# Patient Record
Sex: Female | Born: 1960 | Race: White | Hispanic: No | Smoking: Never smoker
Health system: Southern US, Community
[De-identification: ages and names within clinical notes are randomized; demographics above are authoritative.]

## PROBLEM LIST (undated history)

## (undated) DIAGNOSIS — Z923 Personal history of irradiation: Secondary | ICD-10-CM

## (undated) DIAGNOSIS — F410 Panic disorder [episodic paroxysmal anxiety] without agoraphobia: Secondary | ICD-10-CM

## (undated) DIAGNOSIS — J301 Allergic rhinitis due to pollen: Secondary | ICD-10-CM

## (undated) DIAGNOSIS — K219 Gastro-esophageal reflux disease without esophagitis: Secondary | ICD-10-CM

## (undated) DIAGNOSIS — E785 Hyperlipidemia, unspecified: Secondary | ICD-10-CM

## (undated) DIAGNOSIS — I10 Essential (primary) hypertension: Secondary | ICD-10-CM

## (undated) DIAGNOSIS — F339 Major depressive disorder, recurrent, unspecified: Secondary | ICD-10-CM

## (undated) DIAGNOSIS — M159 Polyosteoarthritis, unspecified: Secondary | ICD-10-CM

## (undated) DIAGNOSIS — C541 Malignant neoplasm of endometrium: Secondary | ICD-10-CM

## (undated) DIAGNOSIS — Z5189 Encounter for other specified aftercare: Secondary | ICD-10-CM

## (undated) HISTORY — DX: Gastro-esophageal reflux disease without esophagitis: K21.9

## (undated) HISTORY — PX: TONSILLECTOMY AND ADENOIDECTOMY: SHX28

## (undated) HISTORY — DX: Essential (primary) hypertension: I10

## (undated) HISTORY — DX: Encounter for other specified aftercare: Z51.89

## (undated) HISTORY — PX: ABDOMINAL HYSTERECTOMY: SHX81

## (undated) HISTORY — DX: Major depressive disorder, recurrent, unspecified: F33.9

## (undated) HISTORY — PX: TONSILLECTOMY: SUR1361

## (undated) HISTORY — PX: TOTAL ABDOMINAL HYSTERECTOMY W/ BILATERAL SALPINGOOPHORECTOMY: SHX83

## (undated) HISTORY — DX: Panic disorder (episodic paroxysmal anxiety): F41.0

## (undated) HISTORY — DX: Polyosteoarthritis, unspecified: M15.9

## (undated) HISTORY — DX: Malignant neoplasm of endometrium: C54.1

## (undated) HISTORY — PX: COLONOSCOPY: SHX174

## (undated) HISTORY — DX: Allergic rhinitis due to pollen: J30.1

## (undated) HISTORY — DX: Hyperlipidemia, unspecified: E78.5

---

## 2010-02-19 ENCOUNTER — Ambulatory Visit: Payer: Self-pay | Admitting: Gastroenterology

## 2013-03-08 ENCOUNTER — Emergency Department: Payer: Self-pay | Admitting: Emergency Medicine

## 2013-09-28 DIAGNOSIS — Z923 Personal history of irradiation: Secondary | ICD-10-CM

## 2013-09-28 HISTORY — DX: Personal history of irradiation: Z92.3

## 2013-10-26 ENCOUNTER — Ambulatory Visit: Payer: Self-pay | Admitting: Gynecologic Oncology

## 2013-10-31 ENCOUNTER — Ambulatory Visit: Payer: Self-pay | Admitting: Gynecologic Oncology

## 2013-10-31 LAB — HCG, QUANTITATIVE, PREGNANCY: BETA HCG, QUANT.: 3 m[IU]/mL

## 2013-10-31 LAB — CREATININE, SERUM
Creatinine: 1.13 mg/dL (ref 0.60–1.30)
EGFR (Non-African Amer.): 55 — ABNORMAL LOW

## 2013-11-01 LAB — CA 125: CA 125: 33.6 U/mL (ref 0.0–34.0)

## 2013-11-07 ENCOUNTER — Ambulatory Visit: Payer: Self-pay | Admitting: Obstetrics and Gynecology

## 2013-11-07 LAB — BASIC METABOLIC PANEL
Anion Gap: 4 — ABNORMAL LOW (ref 7–16)
BUN: 18 mg/dL (ref 7–18)
Calcium, Total: 9.7 mg/dL (ref 8.5–10.1)
Chloride: 105 mmol/L (ref 98–107)
Co2: 28 mmol/L (ref 21–32)
Creatinine: 1.13 mg/dL (ref 0.60–1.30)
EGFR (Non-African Amer.): 55 — ABNORMAL LOW
Glucose: 95 mg/dL (ref 65–99)
Osmolality: 276 (ref 275–301)
POTASSIUM: 4.1 mmol/L (ref 3.5–5.1)
Sodium: 137 mmol/L (ref 136–145)

## 2013-11-07 LAB — CBC
HCT: 32.6 % — ABNORMAL LOW (ref 35.0–47.0)
HGB: 10.9 g/dL — ABNORMAL LOW (ref 12.0–16.0)
MCH: 28.4 pg (ref 26.0–34.0)
MCHC: 33.5 g/dL (ref 32.0–36.0)
MCV: 85 fL (ref 80–100)
PLATELETS: 293 10*3/uL (ref 150–440)
RBC: 3.86 10*6/uL (ref 3.80–5.20)
RDW: 14.8 % — ABNORMAL HIGH (ref 11.5–14.5)
WBC: 6 10*3/uL (ref 3.6–11.0)

## 2013-11-26 ENCOUNTER — Ambulatory Visit: Payer: Self-pay | Admitting: Gynecologic Oncology

## 2013-12-27 ENCOUNTER — Ambulatory Visit: Payer: Self-pay | Admitting: Gynecologic Oncology

## 2014-01-15 ENCOUNTER — Ambulatory Visit: Payer: Self-pay | Admitting: Gynecologic Oncology

## 2014-01-26 ENCOUNTER — Ambulatory Visit: Payer: Self-pay | Admitting: Gynecologic Oncology

## 2014-02-26 ENCOUNTER — Ambulatory Visit: Payer: Self-pay | Admitting: Gynecologic Oncology

## 2014-03-28 ENCOUNTER — Ambulatory Visit: Payer: Self-pay | Admitting: Radiation Oncology

## 2014-08-14 ENCOUNTER — Ambulatory Visit: Payer: Self-pay | Admitting: Oncology

## 2014-08-15 LAB — CREATININE, SERUM: CREATININE: 0.92 mg/dL (ref 0.60–1.30)

## 2014-08-28 ENCOUNTER — Ambulatory Visit: Payer: Self-pay | Admitting: Obstetrics and Gynecology

## 2014-08-28 ENCOUNTER — Ambulatory Visit: Payer: Self-pay | Admitting: Oncology

## 2014-12-12 ENCOUNTER — Ambulatory Visit
Admit: 2014-12-12 | Disposition: A | Discharge status: Home / Self Care | Payer: Self-pay | Attending: Obstetrics and Gynecology | Admitting: Obstetrics and Gynecology

## 2014-12-28 ENCOUNTER — Ambulatory Visit
Admit: 2014-12-28 | Disposition: A | Discharge status: Home / Self Care | Payer: Self-pay | Attending: Obstetrics and Gynecology | Admitting: Obstetrics and Gynecology

## 2015-01-14 ENCOUNTER — Ambulatory Visit
Admit: 2015-01-14 | Disposition: A | Discharge status: Home / Self Care | Payer: Self-pay | Attending: Urgent Care | Admitting: Urgent Care

## 2015-01-19 NOTE — Consult Note (Signed)
Reason for Visit: This 54 year old Female patient presents to the clinic for initial evaluation of  endometrial carcinoma .   Referred by dr. Sabra Heck.  Diagnosis:  Chief Complaint/Diagnosis   54 year old female with high intermediate grade endometrial carcinoma status post TAH/BSO for stage TI B. N0 M0 disease with greater than 50% myometrial invasion an initial large 6 cm tumor for adjuvant vaginal cuff brachytherapy  Pathology Report pathology report reviewed   Imaging Report CT scan reviewed   Referral Report clinical notes reviewed   Planned Treatment Regimen adjuvant brachy therapy to the vaginal cuff   HPI   patient is a 54 year old female gravida 2 para 2 presented with postmenopausal bleeding in about June of 2014. She also had some pelvic pain was seen and underwent endometrial biopsy positive for endometrial carcinoma. She was seen by Dr. Sabra Heck underwent TAH/BSO and pelvic and perineal aortic lymph node sampling. Tumor was 6 x 3.2 x 1.6 overall FIGO grade 2. Myometrial invasion was 1.2 cm out of a total of 1.9 cm thickness. There was positive malignant cells in the peritoneal fluid. Lymphovascular invasion with negative margins were clear 18 pelvic lymph nodes were negative as well as for para-aortic lymph nodes. She has done well postoperatively. He's she is seen today for a radiation oncology opinion. Based on her high intermediate grade tumor she has been recommended for vaginal brachytherapy.she is having some back pain is scheduled for an MRI next week as well as a renal ultrasound.  Past Hx:    hyperlipidemia:    HTN:    blood transfusion:    anxiety:    TAH/BSO:    tonsillectomy:   Past, Family and Social History:  Past Medical History positive   Cardiovascular hyperlipidemia; hypertension   Past Surgical History tonsillectomy, TAH BSO as described above   Past Medical History Comments blood transfusion, degenerative disc disease   Family History positive    Family History Comments family history of sister with breast cancer father with colon polyps family history of type 2 diabetes and cardiovascular heart disease   Social History positive   Social History Comments social EtOH use history, no smoking history   Additional Past Medical and Surgical History accompanied by husband today   Allergies:   Lisinopril: Swelling  chlorobutanol containing compounds: Rash, Itching  chlorhexidine containing compounds: Rash, Itching  Tricor: Unknown  Home Meds:  Home Medications: Medication Instructions Status  simvastatin 40 mg oral tablet 1 tab(s) orally once a day (at bedtime) Active  Xanax 0.5 mg oral tablet 1 tab(s) orally 3 times a day Active  Ventolin CFC free 90 mcg/inh inhalation aerosol 2 puff(s) inhaled 4 times a day, As Needed Active  Flonase 50 mcg/inh nasal spray 1 spray(s) nasal once a day Active  gabapentin 600 mg oral tablet 1 tab(s) orally 3 times a day, As Needed Active  Hyzaar 12.5 mg-50 mg oral tablet 1 tab(s) orally once a day Active  Pataday 0.2% ophthalmic solution 1 drop(s) to each affected eye once a day, As Needed Active  Symbicort 160 mcg-4.5 mcg/inh inhalation aerosol 2 puff(s) inhaled 2 times a day Active  Vitamin D3 5000 intl units oral capsule 1 cap(s) orally once a day Active  buPROPion 150 mg/12 hours oral tablet, extended release 1 tab(s) orally 2 times a day Active   Review of Systems:  General negative   Performance Status (ECOG) 0   Skin negative   Breast negative   Ophthalmologic negative   ENMT negative  Respiratory and Thorax negative   Cardiovascular negative   Gastrointestinal negative   Genitourinary see HPI   Musculoskeletal see HPI   Neurological negative   Psychiatric negative   Hematology/Lymphatics negative   Endocrine negative   Allergic/Immunologic negative   Review of Systems   review of systems obtained from nurses notes  Nursing Notes:  Nursing Vital Signs and  Chemo Nursing Nursing Notes: *CC Vital Signs Flowsheet:   21-Apr-15 10:32  Temp Temperature 98.6  Pulse Pulse 18  Respirations Respirations 18  SBP SBP 152  DBP DBP 91  Pain Scale (0-10)  1  Current Weight (kg) (kg) 86.1  Height (cm) centimeters 168  BSA (m2) 1.9   Physical Exam:  General/Skin/HEENT:  General normal   Skin normal   Eyes normal   ENMT normal   Head and Neck normal   Additional PE well-developed well-nourished female in NAD. Lungs are clear to A&P cardiac examination shows regular rate and rhythm. Abdomen is benign on speculum examination she still has suture material present in the vaginal vault apex although it is healing well. No parametrial masses or nodularities identified on bimanual examination rectal exam is unremarkable. No peripheral edema in her lower extremities is noted.   Breasts/Resp/CV/GI/GU:  Respiratory and Thorax normal   Cardiovascular normal   Gastrointestinal normal   Genitourinary normal   MS/Neuro/Psych/Lymph:  Musculoskeletal normal   Neurological normal   Lymphatics normal   Other Results:  Radiology Results: LabUnknown:    06-Feb-15 14:22, CT Chest, Abd, and Pelvis With Contrast  PACS Image   CT:  CT Chest, Abd, and Pelvis With Contrast   REASON FOR EXAM:    Endometerial CA Staging  COMMENTS:       PROCEDURE: CT  - CT CHEST ABDOMEN AND PELVIS W  - Nov 03 2013  2:22PM     CLINICAL DATA:  Recent diagnosis of endometrial carcinoma.  Staging.    EXAM:  CT CHEST, ABDOMEN, AND PELVIS WITH CONTRAST    TECHNIQUE:  Multidetector CT imaging of the chest, abdomen and pelvis was  performed following the standard protocol during bolus  administration of intravenous contrast.  CONTRAST:  125 mm of Isovue 370 intravenous contrast    COMPARISON:None    FINDINGS:  CT CHEST FINDINGS    No neck base or axillary masses or adenopathy.    Heart and great vessels are unremarkable. No mediastinal or hilar  masses or  adenopathy.    Clear lungs.  No pleural effusion.  No pleural-based mass.    CT ABDOMEN AND PELVIS FINDINGS  Small low-density lesion in the posterior segment of the right lobe  of the liver, likely a cyst. Liver is otherwise unremarkable.    Normal spleen, gallbladder and pancreas. No bile duct dilation. No  adrenal masses. Normal kidneys, ureters and bladder.    Uterus is normal in overall size. There is irregular endometrium  with irregular marginal enhancement. This is consistent with the  diagnosis of endometrial carcinoma. There is no evidence of  extension beyond the uterus. No ovarian/adnexal masses.    No pathologically enlarged lymph nodes. There are no abnormal fluid  collections.    The bowel is unremarkable.  A normal appendix is visualized.  There are degenerative changes most evident along the lower thoracic  spine with significant sclerosis at T10-T11. There are no  osteoblastic or osteolytic lesions.     IMPRESSION:  1. Endometrial carcinoma appears confined in the uterus.  Specifically, there is no evidence  of locally invasive endometrial  carcinoma. There is no evidence of metastatic disease.  2. No acute findings.  3. Chest CT is unremarkable.  4. Small low-density lesion in the liver likely a cyst.  5. There are degenerative changes throughout the visualized spine  most evident in the lower thoracic spine.    Electronically Signed    By: Lajean Manes M.D.    On: 11/03/2013 15:18         Verified By: Lasandra Beech, M.D.,   Relevent Results:   Relevant Scans and Labs CT scans are reviewed   Assessment and Plan: Impression:   stage IB large FIGO grade 2 endometrial carcinoma with greater than 50% myometrial invasionin 54 year old female Plan:   this time based on GOG recommendations would recommend vaginal brachy therapyto the vaginal apex. Would treat in 5 fractions to 2350 cGy using high-dose rate remote afterloading. Risks and benefits of  treatment including some possible diarrhea, urinary frequency urgency, fatigue and ultimate stenosis of the vaginal vault were all explained in detail to the patient. I am going to ignore the positive peritoneal cytology although I will discuss the case with medical oncology. Do not think that she come to my treatment decision as far as external beam treatment. Patient was set up in about a week's time for CT simulation.  I would like to take this opportunity for allowing me to participate in the care of your patient..  CC Referral:  cc: Dr. Alice Reichert   Electronic Signatures: Joban Colledge, Roda Shutters (MD)  (Signed 24-Apr-15 11:22)  Authored: HPI, Diagnosis, Past Hx, PFSH, Allergies, Home Meds, ROS, Nursing Notes, Physical Exam, Other Results, Relevent Results, Encounter Assessment and Plan, CC Referring Physician   Last Updated: 24-Apr-15 11:22 by Armstead Peaks (MD)

## 2015-02-26 ENCOUNTER — Telehealth: Payer: Self-pay | Admitting: *Deleted

## 2015-02-26 NOTE — Telephone Encounter (Signed)
Patient called. "states she has a 'question' about her medical care with Dr. Theora Gianotti" "Please have Joani Cosma call me back."  Call attempt made to reach patient at [phone]. No answer. Left msg that call was returned.

## 2015-02-28 NOTE — Telephone Encounter (Signed)
Attempted to call Patient back. Pt wanted to know when her last apt was scheduled with Dr. Theora Gianotti.  She was last seen on March 16. Her next apt was April 10, 2015.

## 2015-04-10 ENCOUNTER — Inpatient Hospital Stay: Payer: Self-pay | Attending: Obstetrics and Gynecology

## 2015-05-22 ENCOUNTER — Encounter: Payer: Self-pay | Admitting: Family Medicine

## 2015-05-22 NOTE — Progress Notes (Signed)
This encounter was created in error - please disregard.

## 2015-06-24 ENCOUNTER — Ambulatory Visit (INDEPENDENT_AMBULATORY_CARE_PROVIDER_SITE_OTHER): Payer: Self-pay | Admitting: Family Medicine

## 2015-06-24 ENCOUNTER — Encounter: Payer: Self-pay | Admitting: Family Medicine

## 2015-06-24 ENCOUNTER — Encounter (INDEPENDENT_AMBULATORY_CARE_PROVIDER_SITE_OTHER): Payer: Self-pay

## 2015-06-24 VITALS — BP 177/100 | HR 83 | Temp 98.0°F | Ht 65.0 in | Wt 203.2 lb

## 2015-06-24 DIAGNOSIS — M150 Primary generalized (osteo)arthritis: Secondary | ICD-10-CM

## 2015-06-24 DIAGNOSIS — I10 Essential (primary) hypertension: Secondary | ICD-10-CM

## 2015-06-24 DIAGNOSIS — C541 Malignant neoplasm of endometrium: Secondary | ICD-10-CM

## 2015-06-24 DIAGNOSIS — J301 Allergic rhinitis due to pollen: Secondary | ICD-10-CM

## 2015-06-24 DIAGNOSIS — F410 Panic disorder [episodic paroxysmal anxiety] without agoraphobia: Secondary | ICD-10-CM

## 2015-06-24 DIAGNOSIS — E785 Hyperlipidemia, unspecified: Secondary | ICD-10-CM

## 2015-06-24 DIAGNOSIS — F339 Major depressive disorder, recurrent, unspecified: Secondary | ICD-10-CM

## 2015-06-24 DIAGNOSIS — M159 Polyosteoarthritis, unspecified: Secondary | ICD-10-CM

## 2015-06-24 DIAGNOSIS — K219 Gastro-esophageal reflux disease without esophagitis: Secondary | ICD-10-CM

## 2015-06-24 MED ORDER — ALPRAZOLAM 0.5 MG PO TABS: 0.5000 mg | ORAL_TABLET | Freq: Three times a day (TID) | ORAL | Status: DC | PRN

## 2015-06-24 MED ORDER — FLUOXETINE HCL 20 MG PO CAPS: 20.0000 mg | ORAL_CAPSULE | Freq: Every day | ORAL | Status: DC

## 2015-06-24 MED ORDER — MELOXICAM 15 MG PO TABS
15.0000 mg | ORAL_TABLET | Freq: Every day | ORAL | Status: DC
Start: 2015-06-24 — End: 2015-06-24

## 2015-06-24 MED ORDER — HYDROCHLOROTHIAZIDE 25 MG PO TABS: 25.0000 mg | ORAL_TABLET | Freq: Every day | ORAL | Status: DC

## 2015-06-24 MED ORDER — MELOXICAM 15 MG PO TABS
15.0000 mg | ORAL_TABLET | Freq: Every day | ORAL | Status: DC
Start: 2015-06-24 — End: 2015-11-18

## 2015-06-24 MED ORDER — TRAMADOL HCL 50 MG PO TABS: 50.0000 mg | ORAL_TABLET | Freq: Four times a day (QID) | ORAL | Status: DC | PRN

## 2015-06-24 NOTE — Progress Notes (Signed)
Pre visit review using our clinic review tool, if applicable. No additional management support is needed unless otherwise documented below in the visit note. 

## 2015-06-24 NOTE — Progress Notes (Signed)
Dr. Frederico Hamman T. Mica Ramdass, MD, [facility] [place], [address] Phone: [phone] Fax: [phone]  06/24/2015  Patient: Jennifer Holt, MRN: [id number], DOB: Dec 16, 1960, 54 y.o.  Primary Physician:  Owens Loffler, MD  Chief Complaint: Establish Care  Subjective:   Jennifer Holt is a 54 y.o. very pleasant female patient who presents with the following:  New patient visit: seen at the request of Mr. [employer].  Multiple medical problems including a history of stage II intermedial cancer treated with total abdominal hysterectomy and bilateral salpingo-oophorectomy.  She also had radiation therapy by radiation oncology.  This was within the last 2 years.  Hypertension, not well controlled, out of medications right now.  Depression and panic attacks, currently unstable and out of medications for greater than 3 months.  She previously was on Wellbutrin and took some Xanax as needed.  She generally was taking her Xanax somewhere between 1 and 3 times a day.  She also has been having some back pain, but this is not new and is been going on for many years.  She does have some degenerative disc disease and spondyloarthropathy on her prior CT of her abdomen and pelvis, but I do not have this available for my independent review.  hctz 25 prozac 20 mobic 15 Tramadol 50  Xanax 0.5  R knee: patient also about 2 months ago stepped-down had some pain in her right knee with some swelling, and is intermittently been bothering her.  At baseline she is not really very active at all.  Orange card  Back pain  Patient Active Problem List   Diagnosis Date Noted  . FIGO stage II endometrial cancer     Priority: High  . Hypertension   . Hyperlipidemia   . Allergic rhinitis due to pollen   . GERD (gastroesophageal reflux disease)   . Major depressive disorder, recurrent episode with anxious distress   . Panic attacks   .  Osteoarthritis, multiple sites     Past Medical History  Diagnosis Date  . Osteoarthritis, multiple sites   . Major depressive disorder, recurrent episode with anxious distress   . GERD (gastroesophageal reflux disease)   . Allergic rhinitis due to pollen   . Hyperlipidemia   . Hypertension   . Blood transfusion without reported diagnosis   . FIGO stage II endometrial cancer     [facility]. + malignant cells in peritoneal fluid.   . Panic attacks     Past Surgical History  Procedure Laterality Date  . Total abdominal hysterectomy w/ bilateral salpingoophorectomy    . Tonsillectomy and adenoidectomy      Social History   Social History  . Marital Status: Married    Spouse Name: N/A  . Number of Children: N/A  . Years of Education: N/A   Occupational History  . Not on file.   Social History Main Topics  . Smoking status: Never Smoker   . Smokeless tobacco: Never Used  . Alcohol Use: 1.8 - 2.4 oz/week    3-4 Standard drinks or equivalent per week  . Drug Use: No  . Sexual Activity: Not on file   Other Topics Concern  . Not on file   Social History Narrative    Family History  Problem Relation Age of Onset  . Arthritis Mother   . Hyperlipidemia Mother   . Stroke Mother   . Hypertension Mother   . Mental illness Mother   .  Hyperlipidemia Father   . Heart disease Father   . Hypertension Father   . Mental illness Brother   . Stroke Maternal Grandmother   . Hypertension Maternal Grandmother   . Mental illness Brother   . Breast cancer Sister     Allergies  Allergen Reactions  . Lisinopril Swelling  . Prednisone Other (See Comments)    Flu like symptoms    Medication list reviewed and updated in full in [place].   Past Medical History, Surgical History, Social History, Family History, Problem List, Medications, and Allergies have been reviewed and updated if relevant.  Medication list reviewed and updated in full in [place].  GEN: No acute illnesses, no fevers, chills. GI: No n/v/d, eating normally Pulm: No SOB Interactive and getting along well at home.  Otherwise, ROS is as per the HPI.  Objective:   BP 177/100 mmHg  Pulse 83  Temp(Src) 98 F (36.7 C) (Oral)  Ht 5\' 5"  (1.651 m)  Wt 203 lb 4 oz (92.194 kg)  BMI 33.82 kg/m2  GEN: WDWN, NAD, Non-toxic, A & O x 3 HEENT: Atraumatic, Normocephalic. Neck supple. No masses, No LAD. Ears and Nose: No external deformity. CV: RRR, No M/G/R. No JVD. No thrill. No extra heart sounds. PULM: CTA B, no wheezes, crackles, rhonchi. No retractions. No resp. distress. No accessory muscle use. EXTR: No c/c/e NEURO Normal gait.  PSYCH: Normally interactive. Conversant. Not depressed or anxious appearing.  Calm demeanor.   Knee:  R Gait: Normal heel toe pattern ROM: 0-110 Effusion: mild Echymosis or edema: none Patellar tendon NT Painful PLICA: neg Patellar grind: negative Medial and lateral patellar facet loading: negative medial and lateral joint lines: posteromedial pain Mcmurray's + for pain Flexion-pinch + Varus and valgus stress: stable Lachman: neg Ant and Post drawer: neg Hip abduction, IR, ER: WNL Hip flexion str: 5/5 Hip abd: 5/5 Quad: 5/5 VMO atrophy:No Hamstring concentric and eccentric: 5/5   Laboratory and Imaging Data:  Assessment and Plan:   Essential hypertension  FIGO stage II endometrial cancer  Panic attacks  Hyperlipidemia  Allergic rhinitis due to pollen  Gastroesophageal reflux disease, esophagitis presence not specified  Major depressive disorder, recurrent episode with anxious distress  Primary osteoarthritis involving multiple joints  Given financial assistance information for hospital and orange card.   Desires [facility] meds if possible.   Convert to HCTZ 25 mg  Dep / panic: start prozac with prn xanax  mobic and tramadol for pain, back and knee Most likely medial meniscal tear, trial of  conservative management for now.   Follow-up: Return in about 6 weeks (around 08/05/2015).  New Prescriptions   FLUOXETINE (PROZAC) 20 MG CAPSULE    Take 1 capsule (20 mg total) by mouth daily.   HYDROCHLOROTHIAZIDE (HYDRODIURIL) 25 MG TABLET    Take 1 tablet (25 mg total) by mouth daily.   MELOXICAM (MOBIC) 15 MG TABLET    Take 1 tablet (15 mg total) by mouth daily.   No orders of the defined types were placed in this encounter.    Signed,  Maud Deed. Connie Lasater, MD   Patient's Medications  New Prescriptions   FLUOXETINE (PROZAC) 20 MG CAPSULE    Take 1 capsule (20 mg total) by mouth daily.   HYDROCHLOROTHIAZIDE (HYDRODIURIL) 25 MG TABLET    Take 1 tablet (25 mg total) by mouth daily.   MELOXICAM (MOBIC) 15 MG TABLET    Take 1 tablet (15 mg total) by mouth daily.  Previous Medications   No medications on file  Modified Medications   Modified Medication Previous Medication   ALPRAZOLAM (XANAX) 0.5 MG TABLET ALPRAZolam (XANAX) 0.5 MG tablet      Take 1 tablet (0.5 mg total) by mouth 3 (three) times daily as needed for anxiety.    Take 0.5 mg by mouth 3 (three) times daily as needed for anxiety.   TRAMADOL (ULTRAM) 50 MG TABLET traMADol (ULTRAM) 50 MG tablet      Take 1 tablet (50 mg total) by mouth every 6 (six) hours as needed.    Take 50 mg by mouth 2 (two) times daily.  Discontinued Medications   BUPROPION (WELLBUTRIN SR) 150 MG 12 HR TABLET    Take 150 mg by mouth 2 (two) times daily.   LOSARTAN-HYDROCHLOROTHIAZIDE (HYZAAR) 50-12.5 MG PER TABLET    Take 1 tablet by mouth daily.

## 2015-06-25 ENCOUNTER — Encounter: Payer: Self-pay | Admitting: Family Medicine

## 2015-06-25 DIAGNOSIS — K219 Gastro-esophageal reflux disease without esophagitis: Secondary | ICD-10-CM | POA: Insufficient documentation

## 2015-06-25 DIAGNOSIS — M159 Polyosteoarthritis, unspecified: Secondary | ICD-10-CM | POA: Insufficient documentation

## 2015-06-25 DIAGNOSIS — E785 Hyperlipidemia, unspecified: Secondary | ICD-10-CM | POA: Insufficient documentation

## 2015-06-25 DIAGNOSIS — F410 Panic disorder [episodic paroxysmal anxiety] without agoraphobia: Secondary | ICD-10-CM | POA: Insufficient documentation

## 2015-06-25 DIAGNOSIS — I10 Essential (primary) hypertension: Secondary | ICD-10-CM | POA: Insufficient documentation

## 2015-06-25 DIAGNOSIS — F339 Major depressive disorder, recurrent, unspecified: Secondary | ICD-10-CM | POA: Insufficient documentation

## 2015-06-25 DIAGNOSIS — E7800 Pure hypercholesterolemia, unspecified: Secondary | ICD-10-CM | POA: Insufficient documentation

## 2015-06-25 DIAGNOSIS — J301 Allergic rhinitis due to pollen: Secondary | ICD-10-CM | POA: Insufficient documentation

## 2015-06-25 DIAGNOSIS — C541 Malignant neoplasm of endometrium: Secondary | ICD-10-CM | POA: Insufficient documentation

## 2015-07-11 ENCOUNTER — Telehealth: Payer: Self-pay | Admitting: *Deleted

## 2015-07-11 NOTE — Telephone Encounter (Signed)
Spoke with Ivin Booty. She states at first her blood pressure seemed a little better at first after starting the medications but now it is staying high.  She states her highest blood pressure reading was 164/103.  She states on occasion the top number will come down around 140 but the bottom number stays upper 90's.   She states she is only taking HCTZ 25 mg and in the past her BP medication was 50 mg.  Please advise.

## 2015-07-11 NOTE — Telephone Encounter (Signed)
Pt left voicemail at Triage saying her BP meds not working and her BP is still high and wants to know if she needs a higher dose of meds, pt request call back

## 2015-07-11 NOTE — Telephone Encounter (Signed)
Can you find a range of her recent BP so we can make some changes?

## 2015-07-12 MED ORDER — METOPROLOL TARTRATE 25 MG PO TABS: 25.0000 mg | ORAL_TABLET | Freq: Two times a day (BID) | ORAL | Status: DC

## 2015-07-12 NOTE — Telephone Encounter (Signed)
Valia notified as instructed by telephone.  Metoprolol Tartrate prescription sent in to [facility].

## 2015-07-12 NOTE — Telephone Encounter (Signed)
Call  No. HCTZ 25 mg is essentially the highest effective dose for BP, going up to 50 mg increases increases side effects more than it would help.   Add: Metoprolol Tartrate 25 mg, 1 po bid, #60, 5 refills  This is on the [facility] plan.  Electronically Signed  By: Owens Loffler, MD On: 07/12/2015 8:01 AM

## 2015-07-15 NOTE — Telephone Encounter (Signed)
Pt called to verify that pt was to add metoprolol and continue to take the HCTZ 25 mg. Advised pt per 07/12/15 note that is correct. Pt voiced understanding. Sending to Dr Lorelei Pont as Juluis Rainier.

## 2015-08-05 ENCOUNTER — Ambulatory Visit (INDEPENDENT_AMBULATORY_CARE_PROVIDER_SITE_OTHER): Payer: Self-pay | Admitting: Family Medicine

## 2015-08-05 ENCOUNTER — Encounter: Payer: Self-pay | Admitting: Family Medicine

## 2015-08-05 VITALS — BP 138/80 | HR 67 | Temp 98.1°F | Ht 65.0 in | Wt 201.5 lb

## 2015-08-05 DIAGNOSIS — I10 Essential (primary) hypertension: Secondary | ICD-10-CM

## 2015-08-05 DIAGNOSIS — F410 Panic disorder [episodic paroxysmal anxiety] without agoraphobia: Secondary | ICD-10-CM

## 2015-08-05 MED ORDER — FLUOXETINE HCL 40 MG PO CAPS: 40.0000 mg | ORAL_CAPSULE | Freq: Every day | ORAL | Status: DC

## 2015-08-05 NOTE — Progress Notes (Signed)
Pre visit review using our clinic review tool, if applicable. No additional management support is needed unless otherwise documented below in the visit note. 

## 2015-08-05 NOTE — Progress Notes (Signed)
   Dr. Frederico Hamman T. Yarisbel Miranda, MD, [facility] Primary [facility] and Sports Medicine [place], [address] Phone: [phone] Fax: [phone]  08/05/2015  Patient: Jennifer Holt, MRN: [id number], DOB: 09/18/1961, 54 y.o.  Primary Physician:  Owens Loffler, MD   Chief Complaint  Patient presents with  . Follow-up    Blood Pressure   Subjective:   Jennifer Holt is a 53 y.o. very pleasant female patient who presents with the following:  BP: hctz 25 mg. Lopressor 25 mg, only daily now  On prozac 20 mg.  Break-through panic attacks.  Mood doing a lot better.   Past Medical History, Surgical History, Social History, Family History, Problem List, Medications, and Allergies have been reviewed and updated if relevant  GEN: No acute illnesses, no fevers, chills. GI: No n/v/d, eating normally Pulm: No SOB Interactive and getting along well at home.  Otherwise, ROS is as per the HPI.  Objective:   BP 138/80 mmHg  Pulse 67  Temp(Src) 98.1 F (36.7 C) (Oral)  Ht 5\' 5"  (1.651 m)  Wt 201 lb 8 oz (91.4 kg)  BMI 33.53 kg/m2  GEN: WDWN, NAD, Non-toxic, A & O x 3 HEENT: Atraumatic, Normocephalic. Neck supple. No masses, No LAD. Ears and Nose: No external deformity. CV: RRR, No M/G/R. No JVD. No thrill. No extra heart sounds. EXTR: No c/c/e NEURO Normal gait.  PSYCH: Normally interactive. Conversant. Not depressed or anxious appearing.  Calm demeanor.   Laboratory and Imaging Data:  Assessment and Plan:   Essential hypertension  Panic attacks  >10 minutes spent in face to face time with patient, >50% spent in counselling or coordination of care  Not taking lopressor BID - increase to bid Increase prozac to 40 mg  Follow-up: Return in about 3 months (around 11/05/2015).  New Prescriptions   No medications on file   Modified Medications   Modified Medication Previous Medication   FLUOXETINE (PROZAC) 40 MG CAPSULE FLUoxetine (PROZAC) 20 MG  capsule      Take 1 capsule (40 mg total) by mouth daily.    Take 1 capsule (20 mg total) by mouth daily.   No orders of the defined types were placed in this encounter.    Signed,  Maud Deed. Naturi Alarid, MD   Patient's Medications  New Prescriptions   No medications on file  Previous Medications   ALPRAZOLAM (XANAX) 0.5 MG TABLET    Take 1 tablet (0.5 mg total) by mouth 3 (three) times daily as needed for anxiety.   HYDROCHLOROTHIAZIDE (HYDRODIURIL) 25 MG TABLET    Take 1 tablet (25 mg total) by mouth daily.   MELOXICAM (MOBIC) 15 MG TABLET    Take 1 tablet (15 mg total) by mouth daily.   METOPROLOL TARTRATE (LOPRESSOR) 25 MG TABLET    Take 1 tablet (25 mg total) by mouth 2 (two) times daily.   TRAMADOL (ULTRAM) 50 MG TABLET    Take 1 tablet (50 mg total) by mouth every 6 (six) hours as needed.  Modified Medications   Modified Medication Previous Medication   FLUOXETINE (PROZAC) 40 MG CAPSULE FLUoxetine (PROZAC) 20 MG capsule      Take 1 capsule (40 mg total) by mouth daily.    Take 1 capsule (20 mg total) by mouth daily.  Discontinued Medications   No medications on file

## 2015-09-12 ENCOUNTER — Telehealth: Payer: Self-pay

## 2015-09-12 NOTE — Telephone Encounter (Signed)
Left message for patient to return phone call regarding flu shot.  

## 2015-11-06 ENCOUNTER — Ambulatory Visit: Payer: Self-pay | Admitting: Family Medicine

## 2015-11-18 ENCOUNTER — Ambulatory Visit (INDEPENDENT_AMBULATORY_CARE_PROVIDER_SITE_OTHER): Payer: Self-pay | Admitting: Family Medicine

## 2015-11-18 ENCOUNTER — Other Ambulatory Visit: Payer: Self-pay

## 2015-11-18 ENCOUNTER — Encounter: Payer: Self-pay | Admitting: Family Medicine

## 2015-11-18 VITALS — BP 110/64 | HR 70 | Temp 97.6°F | Ht 65.0 in | Wt 208.8 lb

## 2015-11-18 DIAGNOSIS — F410 Panic disorder [episodic paroxysmal anxiety] without agoraphobia: Secondary | ICD-10-CM

## 2015-11-18 DIAGNOSIS — I10 Essential (primary) hypertension: Secondary | ICD-10-CM

## 2015-11-18 DIAGNOSIS — C541 Malignant neoplasm of endometrium: Secondary | ICD-10-CM

## 2015-11-18 MED ORDER — ALPRAZOLAM 0.5 MG PO TABS: 0.5000 mg | ORAL_TABLET | Freq: Three times a day (TID) | ORAL | Status: DC | PRN

## 2015-11-18 MED ORDER — METOPROLOL TARTRATE 25 MG PO TABS: 25.0000 mg | ORAL_TABLET | Freq: Two times a day (BID) | ORAL | Status: DC

## 2015-11-18 MED ORDER — MELOXICAM 15 MG PO TABS: 15.0000 mg | ORAL_TABLET | Freq: Every day | ORAL | Status: DC

## 2015-11-18 MED ORDER — HYDROCHLOROTHIAZIDE 25 MG PO TABS: 25.0000 mg | ORAL_TABLET | Freq: Every day | ORAL | Status: DC

## 2015-11-18 MED ORDER — TRAMADOL HCL 50 MG PO TABS: 50.0000 mg | ORAL_TABLET | Freq: Four times a day (QID) | ORAL | Status: DC | PRN

## 2015-11-18 MED ORDER — FLUOXETINE HCL 40 MG PO CAPS: 40.0000 mg | ORAL_CAPSULE | Freq: Every day | ORAL | Status: DC

## 2015-11-18 NOTE — Telephone Encounter (Signed)
done

## 2015-11-18 NOTE — Progress Notes (Signed)
Dr. Frederico Hamman T. Eyob Godlewski, MD, [facility] Primary [facility] and Sports Medicine [place], [address] Phone: [phone] Fax: [phone]  11/18/2015  Patient: Jennifer Holt, MRN: [id number], DOB: Feb 06, 1961, 55 y.o.  Primary Physician:  Owens Loffler, MD   Chief Complaint  Patient presents with  . Follow-up    3 Month   Subjective:   Jennifer Holt is a 55 y.o. very pleasant female patient who presents with the following:  Panic attacks?  HTN: Tolerating all medications without side effects Stable and at goal No CP, no sob. No HA.  BP Readings from Last 3 Encounters:  11/18/15 110/64  08/05/15 138/80  06/24/15 177/100    Had the flu last week - fever, aching, cough got really tight. Ears are stuffy and chest some.   Missed last cancer MD appt - has not kept up due to finance concerns  Stomach is getting bloated, some back pain.   08/05/2015 Last OV with Owens Loffler, MD  BP: hctz 25 mg. Lopressor 25 mg, only daily now  On prozac 20 mg.  Break-through panic attacks.  Mood doing a lot better.   Past Medical History, Surgical History, Social History, Family History, Problem List, Medications, and Allergies have been reviewed and updated if relevant  GEN: No acute illnesses, no fevers, chills. GI: No n/v/d, eating normally Pulm: No SOB Interactive and getting along well at home.  Otherwise, ROS is as per the HPI.  Objective:   BP 110/64 mmHg  Pulse 70  Temp(Src) 97.6 F (36.4 C) (Oral)  Ht 5\' 5"  (1.651 m)  Wt 208 lb 12 oz (94.688 kg)  BMI 34.74 kg/m2  GEN: WDWN, NAD, Non-toxic, A & O x 3 HEENT: Atraumatic, Normocephalic. Neck supple. No masses, No LAD. Ears and Nose: No external deformity. CV: RRR, No M/G/R. No JVD. No thrill. No extra heart sounds. PULM: Normal respiratory rate, no accessory muscle use. No wheezes, crackles or rhonchi  EXTR: No c/c/e NEURO Normal gait.  PSYCH: Normally interactive. Conversant. Not  depressed or anxious appearing.  Calm demeanor.   Laboratory and Imaging Data:  Assessment and Plan:   Essential hypertension  FIGO stage II endometrial cancer (HCC)  Panic attacks  Increase prozac to 40 mg  Call financial assistance - given all paperwork for [facility] assistance.  F/u with oncology.  Follow-up: 3 mo  New Prescriptions   No medications on file   Modified Medications   Modified Medication Previous Medication   ALPRAZOLAM (XANAX) 0.5 MG TABLET ALPRAZolam (XANAX) 0.5 MG tablet      Take 1 tablet (0.5 mg total) by mouth 3 (three) times daily as needed for anxiety.    Take 1 tablet (0.5 mg total) by mouth 3 (three) times daily as needed for anxiety.   HYDROCHLOROTHIAZIDE (HYDRODIURIL) 25 MG TABLET hydrochlorothiazide (HYDRODIURIL) 25 MG tablet      Take 1 tablet (25 mg total) by mouth daily.    Take 1 tablet (25 mg total) by mouth daily.   MELOXICAM (MOBIC) 15 MG TABLET meloxicam (MOBIC) 15 MG tablet      Take 1 tablet (15 mg total) by mouth daily.    Take 1 tablet (15 mg total) by mouth daily.   METOPROLOL TARTRATE (LOPRESSOR) 25 MG TABLET metoprolol tartrate (LOPRESSOR) 25 MG tablet      Take 1 tablet (25 mg total) by mouth 2 (two) times daily.    Take 1 tablet (25 mg total) by mouth 2 (two)  times daily.   TRAMADOL (ULTRAM) 50 MG TABLET traMADol (ULTRAM) 50 MG tablet      Take 1 tablet (50 mg total) by mouth every 6 (six) hours as needed.    Take 1 tablet (50 mg total) by mouth every 6 (six) hours as needed.   No orders of the defined types were placed in this encounter.    Signed,  Maud Deed. Dimitrius Steedman, MD   Patient's Medications  New Prescriptions   No medications on file  Previous Medications   FLUOXETINE (PROZAC) 40 MG CAPSULE    Take 1 capsule (40 mg total) by mouth daily.  Modified Medications   Modified Medication Previous Medication   ALPRAZOLAM (XANAX) 0.5 MG TABLET ALPRAZolam (XANAX) 0.5 MG tablet      Take 1 tablet (0.5 mg total) by mouth 3  (three) times daily as needed for anxiety.    Take 1 tablet (0.5 mg total) by mouth 3 (three) times daily as needed for anxiety.   HYDROCHLOROTHIAZIDE (HYDRODIURIL) 25 MG TABLET hydrochlorothiazide (HYDRODIURIL) 25 MG tablet      Take 1 tablet (25 mg total) by mouth daily.    Take 1 tablet (25 mg total) by mouth daily.   MELOXICAM (MOBIC) 15 MG TABLET meloxicam (MOBIC) 15 MG tablet      Take 1 tablet (15 mg total) by mouth daily.    Take 1 tablet (15 mg total) by mouth daily.   METOPROLOL TARTRATE (LOPRESSOR) 25 MG TABLET metoprolol tartrate (LOPRESSOR) 25 MG tablet      Take 1 tablet (25 mg total) by mouth 2 (two) times daily.    Take 1 tablet (25 mg total) by mouth 2 (two) times daily.   TRAMADOL (ULTRAM) 50 MG TABLET traMADol (ULTRAM) 50 MG tablet      Take 1 tablet (50 mg total) by mouth every 6 (six) hours as needed.    Take 1 tablet (50 mg total) by mouth every 6 (six) hours as needed.  Discontinued Medications   FLUOXETINE (PROZAC) 20 MG CAPSULE    Take 20 mg by mouth daily.

## 2015-11-18 NOTE — Progress Notes (Signed)
Pre visit review using our clinic review tool, if applicable. No additional management support is needed unless otherwise documented below in the visit note. 

## 2015-11-18 NOTE — Telephone Encounter (Signed)
Jennifer Holt pt fiancee (DPR signed) said that [name] does not have fluoxetine 40 mg rx that pt took to pharmacy on 08/05/15. Spoke with Lexine Baton at [facility] and she cannot find any rx for fluoxetine 40 mg dated 08/05/15. Pt has the fluoxetine 20 mg and will take 2 capsules daily until hears back about 40 mg rx. Jennifer Holt request cb.Please advise.

## 2015-11-19 NOTE — Telephone Encounter (Signed)
Jennifer Holt notified prescription for Fluoxetine 40 mg has been sent into [facility].

## 2016-01-13 IMAGING — CT CT CHEST-ABD-PELV W/ CM
2 of 5 series · 15 of 46 positions shown, 17 images · IV contrast (isovue)
Comparison: None

CLINICAL DATA: Recent diagnosis of endometrial carcinoma.  Staging.

EXAM:
CT CHEST, ABDOMEN, AND PELVIS WITH CONTRAST
TECHNIQUE: Multidetector CT imaging of the chest, abdomen and pelvis was
performed following the standard protocol during bolus
administration of intravenous contrast.
CONTRAST:  125 mm of Isovue 370 intravenous contrast

[Series 2: cap with · axial · 0.89mm/px · z∈[-608,-24]mm · 12 of 133 slices shown, 14 images]
[im 8/133  soft-tissue]
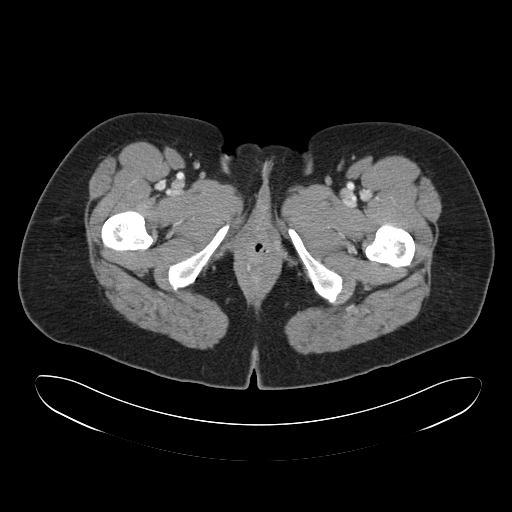
[im 8/133  bone]
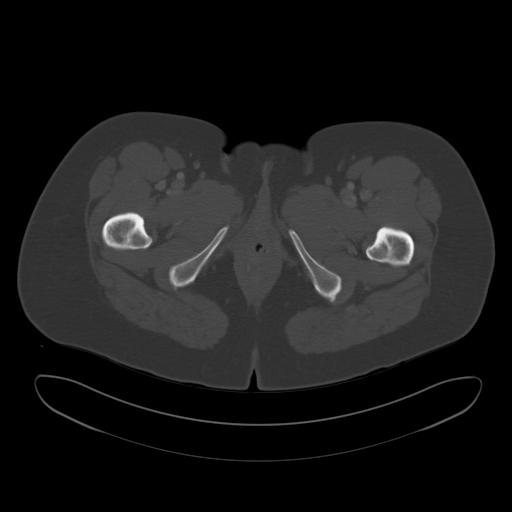
[im 23/133  soft-tissue]
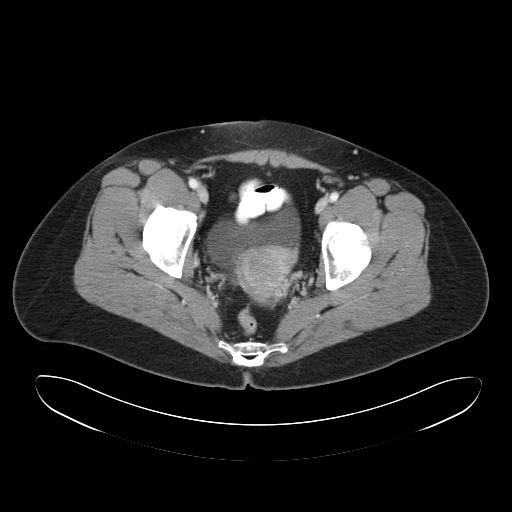
[im 30/133  soft-tissue]
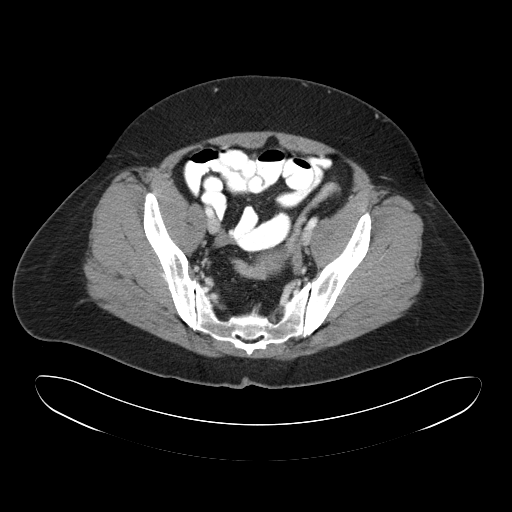
[im 37/133  soft-tissue]
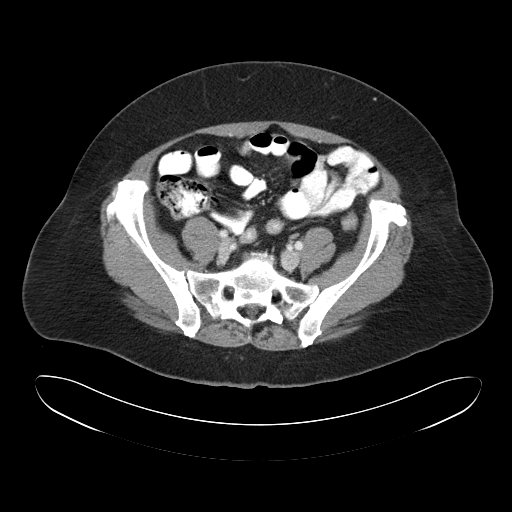
[im 52/133  soft-tissue]
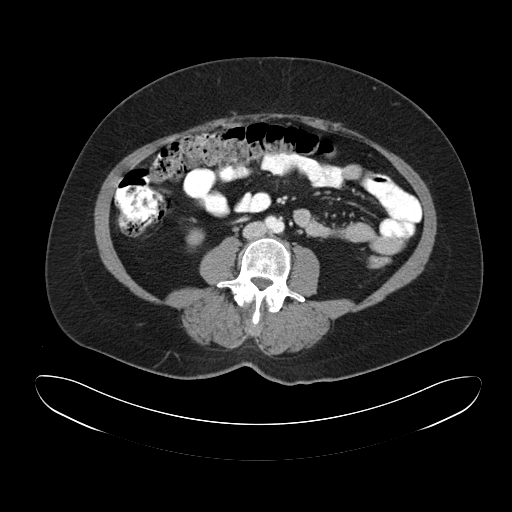
[im 59/133  soft-tissue]
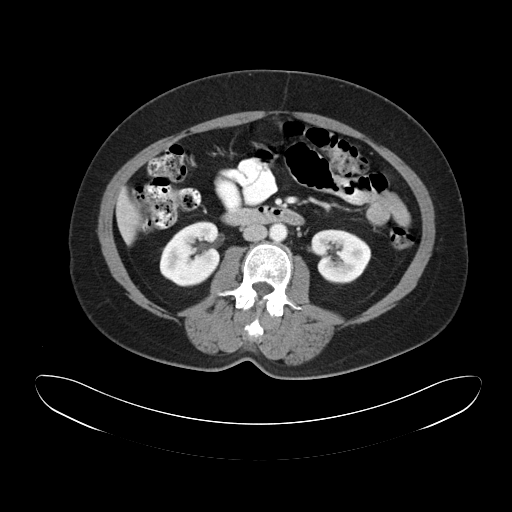
[im 74/133  soft-tissue]
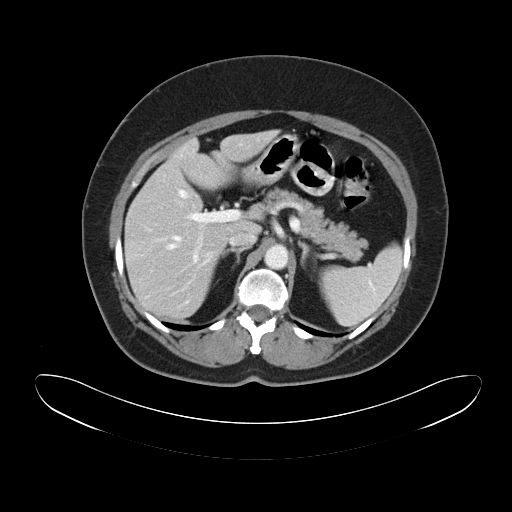
[im 81/133  soft-tissue]
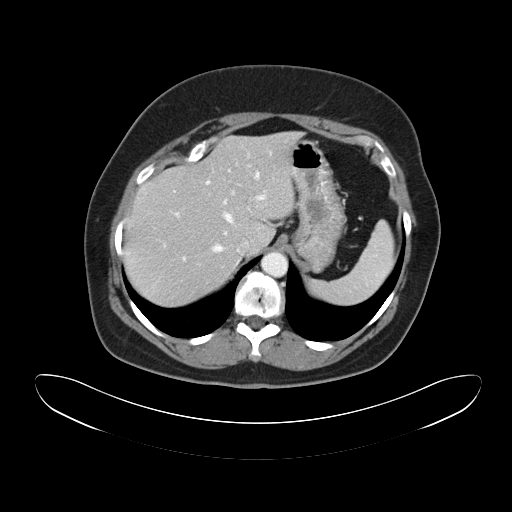
[im 96/133  soft-tissue]
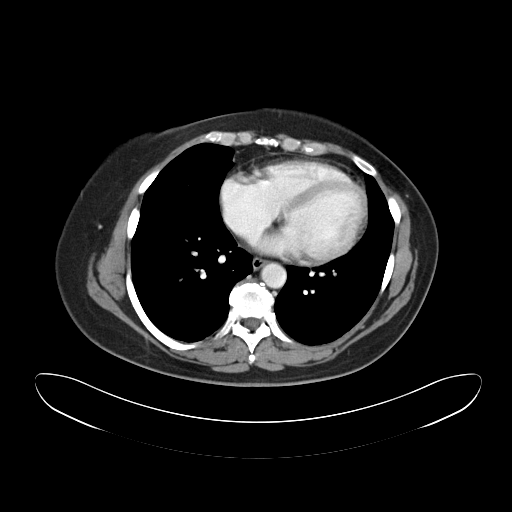
[im 96/133  bone]
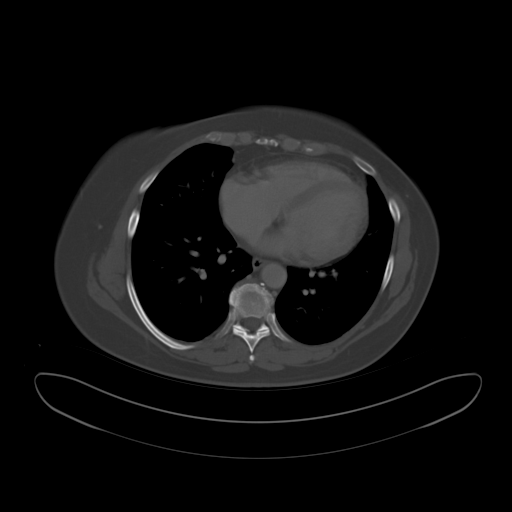
[im 103/133  soft-tissue]
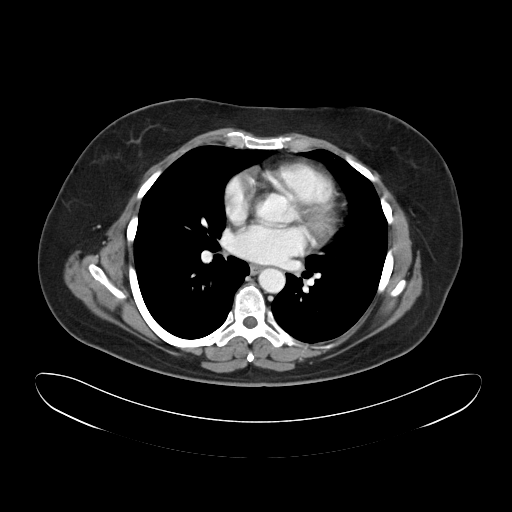
[im 111/133  soft-tissue]
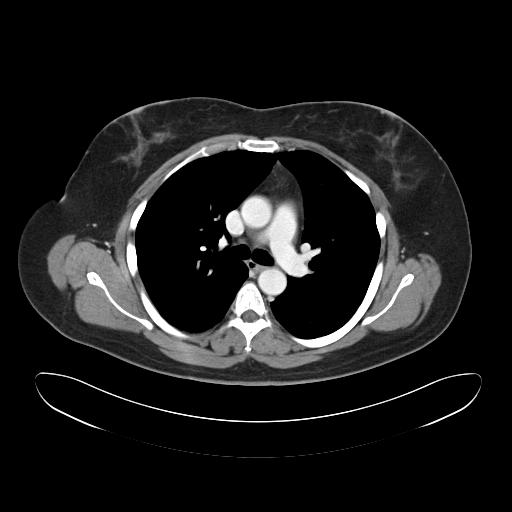
[im 125/133  soft-tissue]
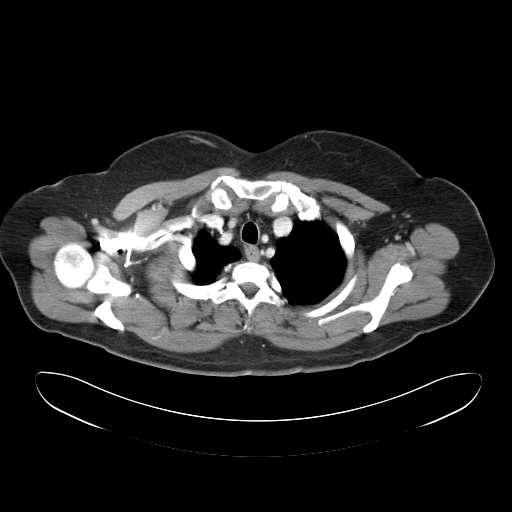

[Series 6: cor cap with · coronal · 0.83mm/px · 3 of 127 slices shown]
[im 43/127  soft-tissue]
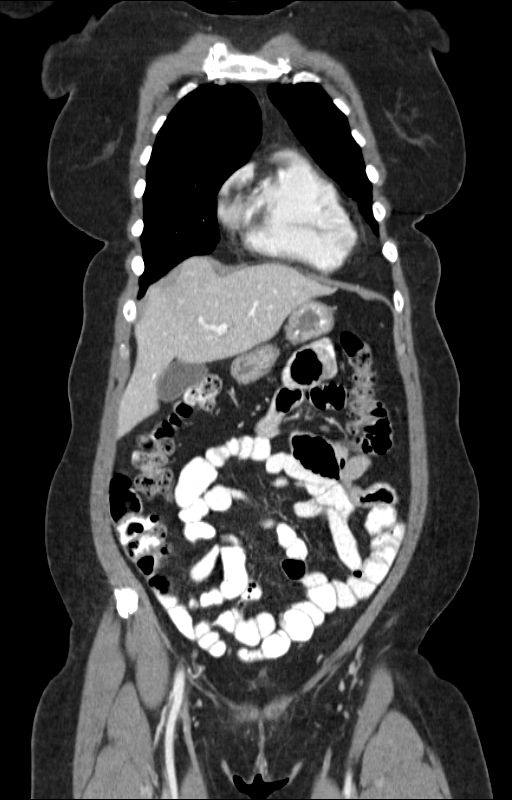
[im 57/127  soft-tissue]
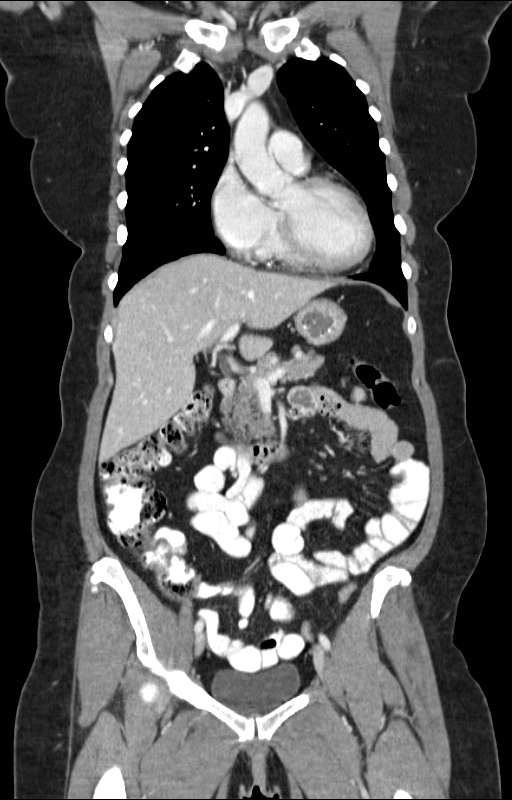
[im 71/127  soft-tissue]
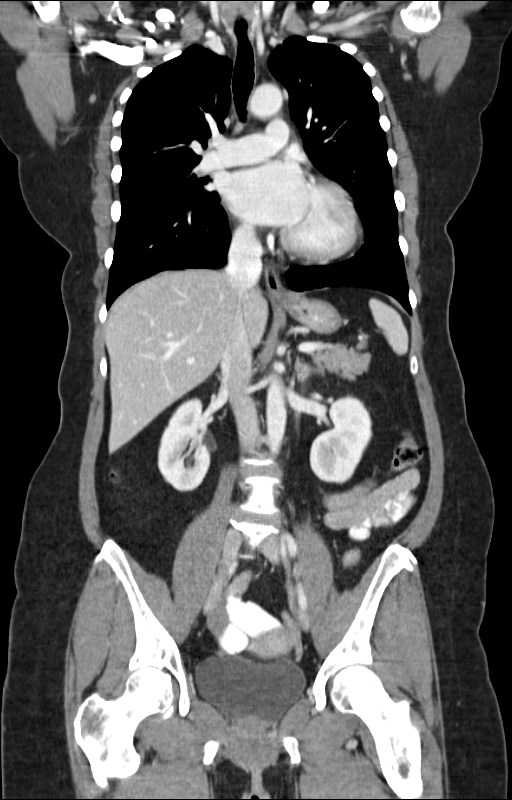

[15 of 46 positions shown; findings below may reference images not displayed]

FINDINGS: CT CHEST FINDINGS

No neck base or axillary masses or adenopathy.

Heart and great vessels are unremarkable. No mediastinal or hilar
masses or adenopathy.

Clear lungs.  No pleural effusion.  No pleural-based mass.

CT ABDOMEN AND PELVIS FINDINGS

Small low-density lesion in the posterior segment of the right lobe
of the liver, likely a cyst. Liver is otherwise unremarkable.

Normal spleen, gallbladder and pancreas. No bile duct dilation. No
adrenal masses. Normal kidneys, ureters and bladder.

Uterus is normal in overall size. There is irregular endometrium
with irregular marginal enhancement. This is consistent with the
diagnosis of endometrial carcinoma. There is no evidence of
extension beyond the uterus. No ovarian/adnexal masses.

No pathologically enlarged lymph nodes. There are no abnormal fluid
collections.

The bowel is unremarkable.  A normal appendix is visualized.

There are degenerative changes most evident along the lower thoracic
spine with significant sclerosis at T10-T11. There are no
osteoblastic or osteolytic lesions.
IMPRESSION: 1. Endometrial carcinoma appears confined in the uterus.
Specifically, there is no evidence of locally invasive endometrial
carcinoma. There is no evidence of metastatic disease.
2. No acute findings.
3. Chest CT is unremarkable.
4. Small low-density lesion in the liver likely a cyst.
5. There are degenerative changes throughout the visualized spine
most evident in the lower thoracic spine.

## 2016-02-19 ENCOUNTER — Ambulatory Visit: Payer: Self-pay | Admitting: Family Medicine

## 2016-03-25 ENCOUNTER — Encounter: Payer: Self-pay | Admitting: Family Medicine

## 2016-03-25 ENCOUNTER — Ambulatory Visit (INDEPENDENT_AMBULATORY_CARE_PROVIDER_SITE_OTHER): Payer: Self-pay | Admitting: Family Medicine

## 2016-03-25 VITALS — BP 128/80 | HR 77 | Temp 97.8°F | Ht 65.0 in | Wt 219.0 lb

## 2016-03-25 DIAGNOSIS — C541 Malignant neoplasm of endometrium: Secondary | ICD-10-CM

## 2016-03-25 DIAGNOSIS — I10 Essential (primary) hypertension: Secondary | ICD-10-CM

## 2016-03-25 DIAGNOSIS — F410 Panic disorder [episodic paroxysmal anxiety] without agoraphobia: Secondary | ICD-10-CM

## 2016-03-25 MED ORDER — MELOXICAM 15 MG PO TABS: 15.0000 mg | ORAL_TABLET | Freq: Every day | ORAL | Status: DC

## 2016-03-25 NOTE — Progress Notes (Signed)
Pre visit review using our clinic review tool, if applicable. No additional management support is needed unless otherwise documented below in the visit note. 

## 2016-03-25 NOTE — Progress Notes (Signed)
Dr. Frederico Hamman T. Christy Friede, MD, [facility] Primary [facility] and Sports Medicine [address] [place], [address] Phone: [phone] Fax: [phone]  03/25/2016  Patient: Jennifer Holt, MRN: [id number], DOB: Jun 14, 1961, 55 y.o.  Primary Physician:  Owens Loffler, MD   Chief Complaint  Patient presents with  . Follow-up    3 month   Subjective:   Jennifer Holt is a 55 y.o. very pleasant female patient who presents with the following:  Wt Readings from Last 3 Encounters:  03/25/16 219 lb (99.338 kg)  11/18/15 208 lb 12 oz (94.688 kg)  08/05/15 201 lb 8 oz (91.4 kg)    HTN: Tolerating all medications without side effects Stable and at goal No CP, no sob. No HA.  BP Readings from Last 3 Encounters:  03/25/16 128/80  11/18/15 110/64  08/05/15 [id number]    Basic Metabolic Panel:    Component Value Date/Time   NA 137 11/07/2013 1615   K 4.1 11/07/2013 1615   CL 105 11/07/2013 1615   CO2 28 11/07/2013 1615   BUN 18 11/07/2013 1615   CREATININE 0.92 08/15/2014 0925   GLUCOSE 95 11/07/2013 1615   CALCIUM 9.7 11/07/2013 1615     She has also had some weight gain.  Panic attacks of been stable. There've been doing quite a bit better.  She wanted to stop her Prozac, and she did not like the way it made her feel.  She has not had any follow-up again about her endometrial cancer.  Edema  Stopped her prozac  Medicaid card.  Past Medical History, Surgical History, Social History, Family History, Problem List, Medications, and Allergies have been reviewed and updated if relevant.  Medication list reviewed and updated in full in [facility].   GEN: No acute illnesses, no fevers, chills. GI: No n/v/d, eating normally Pulm: No SOB Interactive and getting along well at home.  Otherwise, ROS is as per the HPI.  Objective:   BP 128/80 mmHg  Pulse 77  Temp(Src) 97.8 F (36.6 C) (Oral)  Ht 5\' 5"  (1.651 m)  Wt 219 lb (99.338 kg)  BMI 36.44  kg/m2  GEN: WDWN, NAD, Non-toxic, A & O x 3 HEENT: Atraumatic, Normocephalic. Neck supple. No masses, No LAD. Ears and Nose: No external deformity. CV: RRR, No M/G/R. No JVD. No thrill. No extra heart sounds. PULM: CTA B, no wheezes, crackles, rhonchi. No retractions. No resp. distress. No accessory muscle use. EXTR: No c/c/e NEURO Normal gait.  PSYCH: Normally interactive. Conversant. Not depressed or anxious appearing.  Calm demeanor.   Laboratory and Imaging Data:  Assessment and Plan:   Essential hypertension  Panic attacks  FIGO stage II endometrial cancer (HCC)  Blood pressure stable Panic attacks stable. She did stop her SSRI. We will have to see how she does.  I strongly urged her to get follow-up regarding her endometrial cancer.  Patient actually does have Medicaid at this point. It looks like her Medicaid office and card notes that [facility] is her Medicaid site. I reviewed with this means to her. Suggested that it would certainly be much more affordable for her to consider this. Our office does not accept Medicaid at this point.  Follow-up: Return in about 6 months (around 09/24/2016).  New Prescriptions   No medications on file   Modified Medications   Modified Medication Previous Medication   MELOXICAM (MOBIC) 15 MG TABLET meloxicam (MOBIC) 15 MG tablet      Take  1 tablet (15 mg total) by mouth daily.    Take 1 tablet (15 mg total) by mouth daily.   No orders of the defined types were placed in this encounter.    Signed,  Maud Deed. Everest Hacking, MD   Patient's Medications  New Prescriptions   No medications on file  Previous Medications   ALPRAZOLAM (XANAX) 0.5 MG TABLET    Take 1 tablet (0.5 mg total) by mouth 3 (three) times daily as needed for anxiety.   HYDROCHLOROTHIAZIDE (HYDRODIURIL) 25 MG TABLET    Take 1 tablet (25 mg total) by mouth daily.   METOPROLOL TARTRATE (LOPRESSOR) 25 MG TABLET    Take 1 tablet (25 mg total) by mouth 2 (two) times  daily.   TRAMADOL (ULTRAM) 50 MG TABLET    Take 1 tablet (50 mg total) by mouth every 6 (six) hours as needed.  Modified Medications   Modified Medication Previous Medication   MELOXICAM (MOBIC) 15 MG TABLET meloxicam (MOBIC) 15 MG tablet      Take 1 tablet (15 mg total) by mouth daily.    Take 1 tablet (15 mg total) by mouth daily.  Discontinued Medications   FLUOXETINE (PROZAC) 40 MG CAPSULE    Take 1 capsule (40 mg total) by mouth daily.

## 2016-04-27 ENCOUNTER — Other Ambulatory Visit: Payer: Self-pay | Admitting: *Deleted

## 2016-04-27 MED ORDER — ALPRAZOLAM 0.5 MG PO TABS: 0.5000 mg | ORAL_TABLET | Freq: Three times a day (TID) | ORAL | 0 refills | Status: DC | PRN

## 2016-04-27 NOTE — Telephone Encounter (Signed)
Alprazolam called into [facility]. 

## 2016-04-27 NOTE — Telephone Encounter (Signed)
Last office visit 03/25/2016.  Last refilled 11/18/2015 for #90 with 3 refills.  Ok to refill?

## 2016-05-27 ENCOUNTER — Other Ambulatory Visit: Payer: Self-pay | Admitting: *Deleted

## 2016-05-27 MED ORDER — ALPRAZOLAM 0.5 MG PO TABS: 0.5000 mg | ORAL_TABLET | Freq: Three times a day (TID) | ORAL | 1 refills | Status: DC | PRN

## 2016-05-27 MED ORDER — TRAMADOL HCL 50 MG PO TABS
50.0000 mg | ORAL_TABLET | Freq: Four times a day (QID) | ORAL | 5 refills | Status: DC | PRN
Start: 2016-05-27 — End: 2016-12-10

## 2016-05-27 NOTE — Telephone Encounter (Signed)
Last office visit 03/25/2016.  Last refilled: Tramadol-11/18/2015 for #60 with 5 refills.   Alprazolam-04/27/2016 for #90 with no refills.  Ok to refill?

## 2016-05-27 NOTE — Telephone Encounter (Signed)
Alprazolam and Tramadol called into [facility].

## 2016-05-27 NOTE — Telephone Encounter (Signed)
Ok to refill tramadol, #60, 5 refills  Ok to refill xanax, #90, 1 refill

## 2016-08-18 ENCOUNTER — Emergency Department: Payer: Self-pay

## 2016-08-18 ENCOUNTER — Emergency Department
Admission: EM | Admit: 2016-08-18 | Discharge: 2016-08-18 | Disposition: A | Discharge status: Home / Self Care | Payer: Self-pay | Attending: Emergency Medicine | Admitting: Emergency Medicine

## 2016-08-18 DIAGNOSIS — Z79899 Other long term (current) drug therapy: Secondary | ICD-10-CM | POA: Insufficient documentation

## 2016-08-18 DIAGNOSIS — R05 Cough: Secondary | ICD-10-CM | POA: Insufficient documentation

## 2016-08-18 DIAGNOSIS — E785 Hyperlipidemia, unspecified: Secondary | ICD-10-CM | POA: Insufficient documentation

## 2016-08-18 DIAGNOSIS — I10 Essential (primary) hypertension: Secondary | ICD-10-CM | POA: Insufficient documentation

## 2016-08-18 DIAGNOSIS — F419 Anxiety disorder, unspecified: Secondary | ICD-10-CM | POA: Insufficient documentation

## 2016-08-18 DIAGNOSIS — R059 Cough, unspecified: Secondary | ICD-10-CM

## 2016-08-18 DIAGNOSIS — M549 Dorsalgia, unspecified: Secondary | ICD-10-CM | POA: Insufficient documentation

## 2016-08-18 MED ORDER — HYDROCOD POLST-CPM POLST ER 10-8 MG/5ML PO SUER: 5.0000 mL | Freq: Two times a day (BID) | ORAL | 0 refills | Status: DC

## 2016-08-18 NOTE — ED Triage Notes (Signed)
Pt c/o cough for the past 2 weeks and in the past couple of days having upper back pain due to the constant cough.Marland Kitchen

## 2016-08-18 NOTE — ED Provider Notes (Signed)
[facility] Emergency Department Provider Note   ____________________________________________   First MD Initiated Contact with Patient 08/18/16 1056     (approximate)  I have reviewed the triage vital signs and the nursing notes.   HISTORY  Chief Complaint Cough and Back Pain    HPI Jennifer Holt is a 55 y.o. female patient complaining of 2 weeks of productive cough and upper back pain secondary to the cough. Patient state she is expressing coughing spells was irritates her upper back. Patient states the cough was initially productive but now is nonproductive.Patient rates the pain as a 10 over 10. Patient described a pain as "achy". No palliative measures for this complaint.   Past Medical History:  Diagnosis Date  . Allergic rhinitis due to pollen   . Blood transfusion without reported diagnosis   . FIGO stage II endometrial cancer [facility])    [facility]. + malignant cells in peritoneal fluid.   Marland Kitchen GERD (gastroesophageal reflux disease)   . Hyperlipidemia   . Hypertension   . Major depressive disorder, recurrent episode with anxious distress ([place])   . Osteoarthritis, multiple sites   . Panic attacks     Patient Active Problem List   Diagnosis Date Noted  . Hypertension   . Hyperlipidemia   . Allergic rhinitis due to pollen   . GERD (gastroesophageal reflux disease)   . Major depressive disorder, recurrent episode with anxious distress ([place])   . Panic attacks   . Osteoarthritis, multiple sites   . FIGO stage II endometrial cancer [facility])     Past Surgical History:  Procedure Laterality Date  . ABDOMINAL HYSTERECTOMY    . TONSILLECTOMY    . TONSILLECTOMY AND ADENOIDECTOMY    . TOTAL ABDOMINAL HYSTERECTOMY W/ BILATERAL SALPINGOOPHORECTOMY     BSO    Prior to Admission medications   Medication Sig Start Date End Date Taking? Authorizing Provider  ALPRAZolam Duanne Moron) 0.5 MG tablet Take 1 tablet (0.5 mg total) by mouth 3 (three) times  daily as needed for anxiety. 05/27/16   Owens Loffler, MD  chlorpheniramine-HYDROcodone (TUSSIONEX PENNKINETIC ER) 10-8 MG/5ML SUER Take 5 mLs by mouth 2 (two) times daily. 08/18/16   Sable Feil, PA-C  hydrochlorothiazide (HYDRODIURIL) 25 MG tablet Take 1 tablet (25 mg total) by mouth daily. 11/18/15   Owens Loffler, MD  meloxicam (MOBIC) 15 MG tablet Take 1 tablet (15 mg total) by mouth daily. 03/25/16   Owens Loffler, MD  metoprolol tartrate (LOPRESSOR) 25 MG tablet Take 1 tablet (25 mg total) by mouth 2 (two) times daily. 11/18/15   Owens Loffler, MD  traMADol (ULTRAM) 50 MG tablet Take 1 tablet (50 mg total) by mouth every 6 (six) hours as needed. 05/27/16   Owens Loffler, MD    Allergies Ace inhibitors and Prednisone  Family History  Problem Relation Age of Onset  . Arthritis Mother   . Hyperlipidemia Mother   . Stroke Mother   . Hypertension Mother   . Mental illness Mother   . Hyperlipidemia Father   . Heart disease Father   . Hypertension Father   . Mental illness Brother   . Stroke Maternal Grandmother   . Hypertension Maternal Grandmother   . Mental illness Brother   . Breast cancer Sister     Social History Social History  Substance Use Topics  . Smoking status: Never Smoker  . Smokeless tobacco: Never Used  . Alcohol use 1.8 - 2.4 oz/week    3 - 4  Standard drinks or equivalent per week    Review of Systems Constitutional: No fever/chills Eyes: No visual changes. ENT: No sore throat. Cardiovascular: Denies chest pain. Respiratory: Denies shortness of breath. Gastrointestinal: No abdominal pain.  No nausea, no vomiting.  No diarrhea.  No constipation. Genitourinary: Negative for dysuria. Musculoskeletal: Negative for back pain. Skin: Negative for rash. Neurological: Negative for headaches, focal weakness or numbness. Psychiatric:Anxiety and panic attacks. Endocrine:Hyperlipidemia and hypertension. Allergic/Immunilogical: See medication list  ___________________________________________   PHYSICAL EXAM:  VITAL SIGNS: ED Triage Vitals  Enc Vitals Group     BP 08/18/16 1021 (!) 167/85     Pulse Rate 08/18/16 1020 95     Resp 08/18/16 1020 18     Temp 08/18/16 1020 98.2 F (36.8 C)     Temp Source 08/18/16 1020 Oral     SpO2 08/18/16 1020 97 %     Weight 08/18/16 1020 250 lb (113.4 kg)     Height 08/18/16 1020 5\' 6"  (1.676 m)     Head Circumference --      Peak Flow --      Pain Score 08/18/16 1022 10     Pain Loc --      Pain Edu? --      Excl. in [place]? --     Constitutional: Alert and oriented. Well appearing and in no acute distress. Eyes: Conjunctivae are normal. PERRL. EOMI. Head: Atraumatic. Nose: No congestion/rhinnorhea. Mouth/Throat: Mucous membranes are moist.  Oropharynx non-erythematous. Neck: No stridor.  No cervical spine tenderness to palpation. Hematological/Lymphatic/Immunilogical: No cervical lymphadenopathy. Cardiovascular: Normal rate, regular rhythm. Grossly normal heart sounds.  Good peripheral circulation.Elevated blood pressure Respiratory: Normal respiratory effort.  No retractions. Lungs CTAB. Nonproductive cough Gastrointestinal: Soft and nontender. No distention. No abdominal bruits. No CVA tenderness. Musculoskeletal: No lower extremity tenderness nor edema.  No joint effusions. Neurologic:  Normal speech and language. No gross focal neurologic deficits are appreciated. No gait instability. Skin:  Skin is warm, dry and intact. No rash noted. Psychiatric: Mood and affect are normal. Speech and behavior are normal.  ____________________________________________   LABS (all labs ordered are listed, but only abnormal results are displayed)  Labs Reviewed - No data to display ____________________________________________  EKG   ____________________________________________  RADIOLOGY  No acute findings on chest  x-ray. ____________________________________________   PROCEDURES  Procedure(s) performed: None  Procedures  Critical Care performed: No  ____________________________________________   INITIAL IMPRESSION / ASSESSMENT AND PLAN / ED COURSE  Pertinent labs & imaging results that were available during my care of the patient were reviewed by me and considered in my medical decision making (see chart for details).  Persistent cough secondary URI. Discussed negative chest x-ray finding with patient. Patient given discharge care instructions. Patient given prescription for Tussionex and advised follow-up family doctor condition persists.  Clinical Course      ____________________________________________   FINAL CLINICAL IMPRESSION(S) / ED DIAGNOSES  Final diagnoses:  Cough      NEW MEDICATIONS STARTED DURING THIS VISIT:  New Prescriptions   CHLORPHENIRAMINE-HYDROCODONE (TUSSIONEX PENNKINETIC ER) 10-8 MG/5ML SUER    Take 5 mLs by mouth 2 (two) times daily.     Note:  This document was prepared using Dragon voice recognition software and may include unintentional dictation errors.    Sable Feil, PA-C 08/18/16 [place], MD 08/23/16 [phone]

## 2016-09-23 ENCOUNTER — Other Ambulatory Visit: Payer: Self-pay | Admitting: Family Medicine

## 2016-09-24 ENCOUNTER — Other Ambulatory Visit: Payer: Self-pay

## 2016-09-24 MED ORDER — ALPRAZOLAM 0.5 MG PO TABS: 0.5000 mg | ORAL_TABLET | Freq: Three times a day (TID) | ORAL | 1 refills | Status: DC | PRN

## 2016-09-24 NOTE — Telephone Encounter (Signed)
Last filled 07-10-16 #90 Last OV 03-25-16 No Future OV

## 2016-09-24 NOTE — Telephone Encounter (Signed)
Ok to refill #90, 1 refill 

## 2016-09-24 NOTE — Telephone Encounter (Signed)
[place] [facility] [place] [address] [phone]  

## 2016-12-10 ENCOUNTER — Other Ambulatory Visit: Payer: Self-pay | Admitting: Family Medicine

## 2016-12-10 NOTE — Telephone Encounter (Signed)
Rx called in as prescribed and paper Rx destroyed  

## 2016-12-10 NOTE — Telephone Encounter (Signed)
Last OV was 03/25/16, tramadol last filled on 05/27/16 #60 with 5 additional refills, meloxicam last filled on 03/25/16 #90 with 3 additional refills

## 2016-12-14 ENCOUNTER — Other Ambulatory Visit: Payer: Self-pay | Admitting: *Deleted

## 2016-12-14 MED ORDER — ALPRAZOLAM 0.5 MG PO TABS: 0.5000 mg | ORAL_TABLET | Freq: Three times a day (TID) | ORAL | 1 refills | Status: DC | PRN

## 2016-12-14 NOTE — Telephone Encounter (Signed)
Alprazolam called into [facility].

## 2016-12-14 NOTE — Telephone Encounter (Signed)
Ok #90, 1 ref  Please check that this has not already been done.

## 2016-12-14 NOTE — Telephone Encounter (Signed)
Last filled 09/24/16 #90 1R. Last OV 02/2016

## 2016-12-21 ENCOUNTER — Encounter: Payer: Self-pay | Admitting: Family Medicine

## 2016-12-21 ENCOUNTER — Ambulatory Visit (INDEPENDENT_AMBULATORY_CARE_PROVIDER_SITE_OTHER): Payer: Self-pay | Admitting: Family Medicine

## 2016-12-21 VITALS — BP 120/72 | HR 65 | Temp 97.4°F | Ht 64.75 in | Wt 209.8 lb

## 2016-12-21 DIAGNOSIS — F331 Major depressive disorder, recurrent, moderate: Secondary | ICD-10-CM

## 2016-12-21 DIAGNOSIS — F410 Panic disorder [episodic paroxysmal anxiety] without agoraphobia: Secondary | ICD-10-CM

## 2016-12-21 MED ORDER — PAROXETINE HCL 20 MG PO TABS: 20.0000 mg | ORAL_TABLET | Freq: Every day | ORAL | 3 refills | Status: DC

## 2016-12-21 NOTE — Progress Notes (Signed)
   Dr. Frederico Hamman T. Presley Summerlin, MD, [facility] Primary Care and Sports Medicine [place], [address] Phone: [phone] Fax: [phone]  12/21/2016  Patient: MARSHELL RIEGER, MRN: [id number], DOB: [birth year], 56 y.o.  Primary Physician:  Owens Loffler, MD   Chief Complaint  Patient presents with  . Depression  . Anxiety  . Knee Pain    Right  . Heart racing at night   Subjective:   BRAELYN JENSON is a 56 y.o. very pleasant female patient who presents with the following:  Miserable. Has let it go too long.  Had been on wellbutrin before.  Did not like prozac.  Overwhelming.   Allergies.   Objective:   BP 120/72   Pulse 65   Temp 97.4 F (36.3 C) (Oral)   Ht 5' 4.75" (1.645 m)   Wt 209 lb 12 oz (95.1 kg)   BMI 35.17 kg/m   GEN: WDWN, NAD, Non-toxic, A & O x 3 HEENT: Atraumatic, Normocephalic. Neck supple. No masses, No LAD. Ears and Nose: No external deformity.  PSYCH: flat affect, somewhat tearful  Laboratory and Imaging Data:  Assessment and Plan:   Depression, major, recurrent, moderate (HCC)  Panic attacks  >10 minutes spent in face to face time with patient, >50% spent in counselling or coordination of care   Worsened depression. No si or hi. Tearful. Start paxil with close f/u. Decreased energy and interest.  Follow-up: 4 weeks.  Meds ordered this encounter  Medications  . PARoxetine (PAXIL) 20 MG tablet    Sig: Take 1 tablet (20 mg total) by mouth daily.    Dispense:  30 tablet    Refill:  3   Signed,  Kynedi Profitt T. Megann Easterwood, MD   Allergies as of 12/21/2016      Reactions   Ace Inhibitors Swelling   Angioedema.   Prednisone Other (See Comments)   Flu like symptoms      Medication List       Accurate as of 12/21/16  2:08 PM. Always use your most recent med list.          ALPRAZolam 0.5 MG tablet Commonly known as:  XANAX Take 1 tablet (0.5 mg total) by mouth 3 (three) times daily as needed for  anxiety.   hydrochlorothiazide 25 MG tablet Commonly known as:  HYDRODIURIL TAKE 1 TABLET BY MOUTH ONCE A DAY   meloxicam 15 MG tablet Commonly known as:  MOBIC TAKE 1 TABLET BY MOUTH DAILY   metoprolol tartrate 25 MG tablet Commonly known as:  LOPRESSOR TAKE 1 TABLET BY MOUTH TWICE A DAY   PARoxetine 20 MG tablet Commonly known as:  PAXIL Take 1 tablet (20 mg total) by mouth daily.   traMADol 50 MG tablet Commonly known as:  ULTRAM TAKE 1 TABLET BY MOUTH EVERY 6 HOURS AS NEEDED

## 2016-12-21 NOTE — Progress Notes (Signed)
Pre visit review using our clinic review tool, if applicable. No additional management support is needed unless otherwise documented below in the visit note. 

## 2016-12-21 NOTE — Patient Instructions (Signed)
For the first week, only take 1/2 tablet a day, then increase to a full.

## 2017-02-01 ENCOUNTER — Ambulatory Visit: Payer: Self-pay | Admitting: Family Medicine

## 2017-02-03 ENCOUNTER — Ambulatory Visit: Payer: Self-pay | Admitting: Family Medicine

## 2017-02-10 ENCOUNTER — Ambulatory Visit: Payer: Self-pay | Admitting: Family Medicine

## 2017-02-20 ENCOUNTER — Other Ambulatory Visit: Payer: Self-pay | Admitting: Family Medicine

## 2017-02-20 NOTE — Telephone Encounter (Signed)
Last office visit 12/21/16.  Last refilled 12/14/16 for #90 with 1 refill.  Ok to refill?

## 2017-02-23 NOTE — Telephone Encounter (Signed)
Alprazolam called into [facility] - [place] - [address] Phone: [phone].  

## 2017-02-23 NOTE — Telephone Encounter (Signed)
Ok, 90, 2 ref 

## 2017-04-26 ENCOUNTER — Other Ambulatory Visit: Payer: Self-pay | Admitting: *Deleted

## 2017-04-26 NOTE — Telephone Encounter (Signed)
Last office visit 12/21/2016.  Last labs 2015.  Ok to refill?

## 2017-04-27 MED ORDER — HYDROCHLOROTHIAZIDE 25 MG PO TABS: 25.0000 mg | ORAL_TABLET | Freq: Every day | ORAL | 1 refills | Status: DC

## 2017-06-29 ENCOUNTER — Other Ambulatory Visit: Payer: Self-pay | Admitting: *Deleted

## 2017-06-29 NOTE — Telephone Encounter (Signed)
Last office visit 12/21/2016.  Last refilled 02/23/2017 for #90 with 2 refills.  Ok to refill?

## 2017-06-30 MED ORDER — ALPRAZOLAM 0.5 MG PO TABS: 0.5000 mg | ORAL_TABLET | Freq: Three times a day (TID) | ORAL | 2 refills | Status: DC | PRN

## 2017-06-30 NOTE — Telephone Encounter (Signed)
Alprazolam called into [place], [place] Phone: [phone].

## 2017-06-30 NOTE — Telephone Encounter (Signed)
Ok, 90, 2 ref

## 2017-07-14 ENCOUNTER — Encounter: Payer: Self-pay | Admitting: Family Medicine

## 2017-07-14 ENCOUNTER — Ambulatory Visit (INDEPENDENT_AMBULATORY_CARE_PROVIDER_SITE_OTHER): Payer: Medicare Other | Admitting: Family Medicine

## 2017-07-14 VITALS — BP 154/82 | HR 62 | Temp 98.2°F | Ht 64.75 in | Wt 212.5 lb

## 2017-07-14 DIAGNOSIS — I10 Essential (primary) hypertension: Secondary | ICD-10-CM | POA: Diagnosis not present

## 2017-07-14 DIAGNOSIS — F339 Major depressive disorder, recurrent, unspecified: Secondary | ICD-10-CM

## 2017-07-14 DIAGNOSIS — R5383 Other fatigue: Secondary | ICD-10-CM | POA: Diagnosis not present

## 2017-07-14 DIAGNOSIS — E782 Mixed hyperlipidemia: Secondary | ICD-10-CM

## 2017-07-14 DIAGNOSIS — Z79899 Other long term (current) drug therapy: Secondary | ICD-10-CM

## 2017-07-14 DIAGNOSIS — C541 Malignant neoplasm of endometrium: Secondary | ICD-10-CM

## 2017-07-14 DIAGNOSIS — R739 Hyperglycemia, unspecified: Secondary | ICD-10-CM

## 2017-07-14 DIAGNOSIS — E559 Vitamin D deficiency, unspecified: Secondary | ICD-10-CM | POA: Diagnosis not present

## 2017-07-14 DIAGNOSIS — F410 Panic disorder [episodic paroxysmal anxiety] without agoraphobia: Secondary | ICD-10-CM | POA: Diagnosis not present

## 2017-07-14 LAB — CBC WITH DIFFERENTIAL/PLATELET
BASOS PCT: 0.8 % (ref 0.0–3.0)
Basophils Absolute: 0 10*3/uL (ref 0.0–0.1)
EOS PCT: 1.6 % (ref 0.0–5.0)
Eosinophils Absolute: 0.1 10*3/uL (ref 0.0–0.7)
HEMATOCRIT: 38.9 % (ref 36.0–46.0)
HEMOGLOBIN: 12.8 g/dL (ref 12.0–15.0)
LYMPHS PCT: 24.3 % (ref 12.0–46.0)
Lymphs Abs: 1.2 10*3/uL (ref 0.7–4.0)
MCHC: 33 g/dL (ref 30.0–36.0)
MCV: 95.7 fl (ref 78.0–100.0)
MONO ABS: 0.4 10*3/uL (ref 0.1–1.0)
Monocytes Relative: 8.2 % (ref 3.0–12.0)
NEUTROS ABS: 3.1 10*3/uL (ref 1.4–7.7)
Neutrophils Relative %: 65.1 % (ref 43.0–77.0)
PLATELETS: 276 10*3/uL (ref 150.0–400.0)
RBC: 4.06 Mil/uL (ref 3.87–5.11)
RDW: 12.2 % (ref 11.5–15.5)
WBC: 4.8 10*3/uL (ref 4.0–10.5)

## 2017-07-14 LAB — BASIC METABOLIC PANEL
BUN: 22 mg/dL (ref 6–23)
CALCIUM: 10.1 mg/dL (ref 8.4–10.5)
CO2: 27 meq/L (ref 19–32)
Chloride: 101 mEq/L (ref 96–112)
Creatinine, Ser: 0.88 mg/dL (ref 0.40–1.20)
GFR: 70.05 mL/min (ref >60.00)
GLUCOSE: 119 mg/dL — AB (ref 70–99)
POTASSIUM: 5 meq/L (ref 3.5–5.1)
SODIUM: 138 meq/L (ref 135–145)

## 2017-07-14 LAB — LIPID PANEL
CHOLESTEROL: 305 mg/dL — AB (ref 0–200)
HDL: 47.4 mg/dL (ref >39.00)
NonHDL: 258.07
Total CHOL/HDL Ratio: 6
Triglycerides: 328 mg/dL — ABNORMAL HIGH (ref 0.0–149.0)
VLDL: 65.6 mg/dL — AB (ref 0.0–40.0)

## 2017-07-14 LAB — HEMOGLOBIN A1C: HEMOGLOBIN A1C: 5.7 % (ref 4.6–6.5)

## 2017-07-14 LAB — HEPATIC FUNCTION PANEL
ALT: 21 U/L (ref 0–35)
AST: 19 U/L (ref 0–37)
Albumin: 4.5 g/dL (ref 3.5–5.2)
Alkaline Phosphatase: 70 U/L (ref 39–117)
Bilirubin, Direct: 0 mg/dL (ref 0.0–0.3)
Total Bilirubin: 0.6 mg/dL (ref 0.2–1.2)
Total Protein: 7.5 g/dL (ref 6.0–8.3)

## 2017-07-14 LAB — LDL CHOLESTEROL, DIRECT: Direct LDL: 195 mg/dL

## 2017-07-14 LAB — TSH: TSH: 2.32 u[IU]/mL (ref 0.35–4.50)

## 2017-07-14 LAB — VITAMIN D 25 HYDROXY (VIT D DEFICIENCY, FRACTURES): VITD: 42.72 ng/mL (ref 30.00–100.00)

## 2017-07-14 MED ORDER — METOPROLOL TARTRATE 50 MG PO TABS
50.0000 mg | ORAL_TABLET | Freq: Two times a day (BID) | ORAL | 1 refills | Status: DC
Start: 2017-07-14 — End: 2017-10-18

## 2017-07-14 MED ORDER — BUPROPION HCL ER (SR) 150 MG PO TB12: 150.0000 mg | ORAL_TABLET | Freq: Two times a day (BID) | ORAL | 5 refills | Status: DC

## 2017-07-14 NOTE — Patient Instructions (Signed)
With Wellbutrin, take 1 tab in the morning x 1 week, then increase to twice a day.

## 2017-07-14 NOTE — Progress Notes (Signed)
Dr. Frederico Hamman T. Chelsi Warr, MD, [facility] [place], [address] Phone: [phone] Fax: [phone]  07/14/2017  Patient: Jennifer Holt, MRN: [id number], DOB: 01-04-1961, 56 y.o.  Primary Physician:  Owens Loffler, MD   Chief Complaint  Patient presents with  . Follow-up    Blood Pressure   Subjective:   MARGARETT VITI is a 56 y.o. very pleasant female patient who presents with the following:  F/u BP: she actually presents for follow-up on multiple medical problems.  She has been without any health insurance for a number of years, since she has declined investigated services her treatment on multiple problems including her endometrial cancer.  She has not gone back to any the cancer doctors in several years.  Anxiety and depression, ongoing and mild to moderate currently.  She was unable to tolerate any SSRIs in the past, but she did do better with some Wellbutrin.  History of hypertension and hyperlipidemia.  wellbutrin sr 150  Gyn / ONC  Past Medical History, Surgical History, Social History, Family History, Problem List, Medications, and Allergies have been reviewed and updated if relevant.  Patient Active Problem List   Diagnosis Date Noted  . FIGO stage II endometrial cancer ([place])     Priority: High  . Hypertension   . Hyperlipidemia   . Allergic rhinitis due to pollen   . GERD (gastroesophageal reflux disease)   . Major depressive disorder, recurrent episode with anxious distress ([place])   . Panic attacks   . Osteoarthritis, multiple sites     Past Medical History:  Diagnosis Date  . Allergic rhinitis due to pollen   . Blood transfusion without reported diagnosis   . FIGO stage II endometrial cancer [facility])    [facility]. + malignant cells in peritoneal fluid.   Marland Kitchen GERD (gastroesophageal reflux disease)   . Hyperlipidemia   . Hypertension   . Major depressive disorder, recurrent episode with  anxious distress ([place])   . Osteoarthritis, multiple sites   . Panic attacks     Past Surgical History:  Procedure Laterality Date  . ABDOMINAL HYSTERECTOMY    . TONSILLECTOMY    . TONSILLECTOMY AND ADENOIDECTOMY    . TOTAL ABDOMINAL HYSTERECTOMY W/ BILATERAL SALPINGOOPHORECTOMY     BSO    Social History   Social History  . Marital status: Married    Spouse name: N/A  . Number of children: N/A  . Years of education: N/A   Occupational History  . Not on file.   Social History Main Topics  . Smoking status: Never Smoker  . Smokeless tobacco: Never Used  . Alcohol use 1.8 - 2.4 oz/week    3 - 4 Standard drinks or equivalent per week  . Drug use: No  . Sexual activity: Not on file   Other Topics Concern  . Not on file   Social History Narrative  . No narrative on file    Family History  Problem Relation Age of Onset  . Arthritis Mother   . Hyperlipidemia Mother   . Stroke Mother   . Hypertension Mother   . Mental illness Mother   . Hyperlipidemia Father   . Heart disease Father   . Hypertension Father   . Mental illness Brother   . Stroke Maternal Grandmother   . Hypertension Maternal Grandmother   . Mental illness Brother   . Breast cancer Sister     Allergies  Allergen Reactions  .  Ace Inhibitors Swelling    Angioedema.  . Prednisone Other (See Comments)    Flu like symptoms    Medication list reviewed and updated in full in [place].   GEN: No acute illnesses, no fevers, chills. GI: No n/v/d, eating normally Pulm: No SOB Interactive and getting along well at home.  Otherwise, ROS is as per the HPI.  Objective:   BP (!) 154/82   Pulse 62   Temp 98.2 F (36.8 C) (Oral)   Ht 5' 4.75" (1.645 m)   Wt 212 lb 8 oz (96.4 kg)   BMI 35.64 kg/m   GEN: WDWN, NAD, Non-toxic, A & O x 3 HEENT: Atraumatic, Normocephalic. Neck supple. No masses, No LAD. Ears and Nose: No external deformity. CV: RRR, No M/G/R. No JVD. No thrill. No extra  heart sounds. PULM: CTA B, no wheezes, crackles, rhonchi. No retractions. No resp. distress. No accessory muscle use. EXTR: No c/c/e NEURO Normal gait.  PSYCH: Normally interactive. Conversant. Not depressed or anxious appearing.  Calm demeanor.   Laboratory and Imaging Data:  Assessment and Plan:   FIGO stage II endometrial cancer ([place]) - Plan: Ambulatory referral to Gynecologic Oncology, CA 125  Essential hypertension  Mixed hyperlipidemia - Plan: Lipid panel  Encounter for long-term (current) use of medications - Plan: Basic metabolic panel, CBC with Differential/Platelet, Hepatic function panel  Other fatigue - Plan: TSH  Hyperglycemia - Plan: Hemoglobin A1c  Vitamin D deficiency - Plan: VITAMIN D 25 Hydroxy (Vit-D Deficiency, Fractures)  Major depressive disorder, recurrent episode with anxious distress (HCC)  Panic attacks  Increase metoprolol dosing for elevated blood pressure.  Start wellbutrin for depression and panic attacks  Check other baseline labs  Follow-up: No Follow-up on file.  Future Appointments Date Time Provider [place]  07/21/2017 8:45 AM [facility] None  09/15/2017 9:00 AM Tawnia Schirm, Frederico Hamman, MD [facility]    Meds ordered this encounter  Medications  . fluticasone (FLONASE) 50 MCG/ACT nasal spray    Sig: Place 2 sprays into both nostrils daily.  . cetirizine (ZYRTEC) 10 MG tablet    Sig: Take 10 mg by mouth daily.  Marland Kitchen buPROPion (WELLBUTRIN SR) 150 MG 12 hr tablet    Sig: Take 1 tablet (150 mg total) by mouth 2 (two) times daily.    Dispense:  60 tablet    Refill:  5  . metoprolol tartrate (LOPRESSOR) 50 MG tablet    Sig: Take 1 tablet (50 mg total) by mouth 2 (two) times daily.    Dispense:  180 tablet    Refill:  1   Medications Discontinued During This Encounter  Medication Reason  . PARoxetine (PAXIL) 20 MG tablet Side effect (s)  . metoprolol tartrate (LOPRESSOR) 25 MG tablet Reorder   Orders  Placed This Encounter  Procedures  . Lipid panel  . Basic metabolic panel  . CBC with Differential/Platelet  . Hepatic function panel  . Hemoglobin A1c  . TSH  . VITAMIN D 25 Hydroxy (Vit-D Deficiency, Fractures)  . CA 125  . LDL cholesterol, direct  . Ambulatory referral to Gynecologic Oncology    Signed,  Maud Deed. Broughton Eppinger, MD   Allergies as of 07/14/2017      Reactions   Ace Inhibitors Swelling   Angioedema.   Prednisone Other (See Comments)   Flu like symptoms      Medication List       Accurate as of 07/14/17 11:59 PM. Always use your most recent  med list.          ALPRAZolam 0.5 MG tablet Commonly known as:  XANAX Take 1 tablet (0.5 mg total) by mouth 3 (three) times daily as needed.   buPROPion 150 MG 12 hr tablet Commonly known as:  WELLBUTRIN SR Take 1 tablet (150 mg total) by mouth 2 (two) times daily.   cetirizine 10 MG tablet Commonly known as:  ZYRTEC Take 10 mg by mouth daily.   fluticasone 50 MCG/ACT nasal spray Commonly known as:  FLONASE Place 2 sprays into both nostrils daily.   hydrochlorothiazide 25 MG tablet Commonly known as:  HYDRODIURIL Take 1 tablet (25 mg total) by mouth daily.   meloxicam 15 MG tablet Commonly known as:  MOBIC TAKE 1 TABLET BY MOUTH DAILY   metoprolol tartrate 50 MG tablet Commonly known as:  LOPRESSOR Take 1 tablet (50 mg total) by mouth 2 (two) times daily.   traMADol 50 MG tablet Commonly known as:  ULTRAM TAKE 1 TABLET BY MOUTH EVERY 6 HOURS AS NEEDED

## 2017-07-15 LAB — CA 125: CA 125: 7 U/mL (ref <35)

## 2017-07-21 ENCOUNTER — Inpatient Hospital Stay: Payer: Medicare Other | Attending: Obstetrics and Gynecology | Admitting: Obstetrics and Gynecology

## 2017-07-21 ENCOUNTER — Encounter: Payer: Self-pay | Admitting: Obstetrics and Gynecology

## 2017-07-21 VITALS — BP 156/95 | HR 74 | Temp 97.0°F | Resp 18 | Ht 64.75 in | Wt 211.6 lb

## 2017-07-21 DIAGNOSIS — R140 Abdominal distension (gaseous): Secondary | ICD-10-CM | POA: Insufficient documentation

## 2017-07-21 DIAGNOSIS — I10 Essential (primary) hypertension: Secondary | ICD-10-CM

## 2017-07-21 DIAGNOSIS — Z90722 Acquired absence of ovaries, bilateral: Secondary | ICD-10-CM

## 2017-07-21 DIAGNOSIS — E785 Hyperlipidemia, unspecified: Secondary | ICD-10-CM

## 2017-07-21 DIAGNOSIS — C541 Malignant neoplasm of endometrium: Secondary | ICD-10-CM | POA: Diagnosis not present

## 2017-07-21 DIAGNOSIS — R5383 Other fatigue: Secondary | ICD-10-CM | POA: Insufficient documentation

## 2017-07-21 DIAGNOSIS — Z90710 Acquired absence of both cervix and uterus: Secondary | ICD-10-CM | POA: Diagnosis not present

## 2017-07-21 DIAGNOSIS — R1013 Epigastric pain: Secondary | ICD-10-CM | POA: Insufficient documentation

## 2017-07-21 DIAGNOSIS — K219 Gastro-esophageal reflux disease without esophagitis: Secondary | ICD-10-CM | POA: Diagnosis not present

## 2017-07-21 DIAGNOSIS — Z79899 Other long term (current) drug therapy: Secondary | ICD-10-CM | POA: Insufficient documentation

## 2017-07-21 DIAGNOSIS — Z923 Personal history of irradiation: Secondary | ICD-10-CM | POA: Diagnosis not present

## 2017-07-21 DIAGNOSIS — G8929 Other chronic pain: Secondary | ICD-10-CM | POA: Diagnosis not present

## 2017-07-21 DIAGNOSIS — M5136 Other intervertebral disc degeneration, lumbar region: Secondary | ICD-10-CM | POA: Insufficient documentation

## 2017-07-21 NOTE — Progress Notes (Signed)
Gynecologic Oncology Consult Visit   Referring Provider: Owens Loffler, MD [address] [place], [place] [address] [phone]   Chief Concern: surveillance endometrial cancer   Subjective:  Jennifer Holt is a 56 y.o. female who is seen in consultation from Dr. Lorelei Pont for surveillance for endometrial cancer. 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 Aortic L M.D., omental biopsy and washings performed to 11/23/2013. She underwent adjuvant radiotherapy with vaginal brachytherapy for stage Ib, grade 2, positive cytology endometrial cancer. Tumor size was reported as 6 x 3.2 x 1.6 cm. No LVSI and without other evidence of extrauterine disease.   Patient was lost to follow-up due to lack of insurance coverage and returns to clinic today for surveillance. She has established primary care with Dr. Lorelei Pont. Patient has multiple complaints including fatigue epigastric pain and bloating with burping and flatulence, worsened by large meals. Patient believes she had a colonoscopy in 2011 that was normal and does not regularly see GI. She has a history of chronic back pain. Review of MRI from 01/25/2014 shows evidence of degenerative disc disease most significantly at L4-L5. She reports that this pain is unchanged. Today, she denies any abnormal bleeding pelvic or lower abdominal pain,cough, or weight loss. CT scan from 08/17/2014 showed 6 mm hypodensity in the right lobe of the liver posteriorly that was consistent with previously exams, stable,benign in appearance. No evidence of disease on CT.  She presents today to continue surveillance.    Problem List: Patient Active Problem List   Diagnosis Date Noted  . Hypertension   . Hyperlipidemia   . Allergic rhinitis due to pollen   . GERD (gastroesophageal reflux disease)   . Major depressive disorder, recurrent episode with anxious distress ([place])   . Panic attacks   . Osteoarthritis,  multiple sites   . FIGO stage II endometrial cancer [facility])     Past Medical History: Past Medical History:  Diagnosis Date  . Allergic rhinitis due to pollen   . Blood transfusion without reported diagnosis   . FIGO stage II endometrial cancer [facility])    [facility]. + malignant cells in peritoneal fluid.   Marland Kitchen GERD (gastroesophageal reflux disease)   . Hyperlipidemia   . Hypertension   . Major depressive disorder, recurrent episode with anxious distress ([place])   . Osteoarthritis, multiple sites   . Panic attacks     Past Surgical History: Past Surgical History:  Procedure Laterality Date  . ABDOMINAL HYSTERECTOMY    . TONSILLECTOMY    . TONSILLECTOMY AND ADENOIDECTOMY    . TOTAL ABDOMINAL HYSTERECTOMY W/ BILATERAL SALPINGOOPHORECTOMY     BSO    Past Gynecologic History:  Menarche: No longer menstruates  History of OCP/HRT use: No use of HRT History of Abnormal pap: yes, per gyn hx; pt unsure of findings Last pap: per gyn hx History of STDs: The patient denies history of sexually transmitted disease. Contraception: TAH BSO Sexually active: yes  OB History:  OB History  No data available  Gravida 2 Para 2  Family History: Family History  Problem Relation Age of Onset  . Arthritis Mother   . Hyperlipidemia Mother   . Stroke Mother   . Hypertension Mother   . Mental illness Mother   . Hyperlipidemia Father   . Heart disease Father   . Hypertension Father   . Mental illness Brother   . Stroke Maternal Grandmother   . Hypertension Maternal Grandmother   . Mental  illness Brother   . Breast cancer Sister   . Breast cancer Paternal Aunt   . Cancer Paternal Aunt    Social History: Social History   Social History  . Marital status: Married    Spouse name: N/A  . Number of children: N/A  . Years of education: N/A   Occupational History  . Not on file.   Social History Main Topics  . Smoking status: Never Smoker  . Smokeless tobacco: Never Used  . Alcohol  use 1.8 - 2.4 oz/week    3 - 4 Standard drinks or equivalent per week     Comment: wine or liquor once a week  . Drug use: No  . Sexual activity: Yes    Birth control/ protection: Surgical   Other Topics Concern  . Not on file   Social History Narrative  . No narrative on file   Allergies: Allergies  Allergen Reactions  . Ace Inhibitors Swelling    Angioedema.  . Prednisone Other (See Comments)    Flu like symptoms   Current Medications: Current Outpatient Prescriptions  Medication Sig Dispense Refill  . ALPRAZolam (XANAX) 0.5 MG tablet Take 1 tablet (0.5 mg total) by mouth 3 (three) times daily as needed. 90 tablet 2  . buPROPion (WELLBUTRIN SR) 150 MG 12 hr tablet Take 1 tablet (150 mg total) by mouth 2 (two) times daily. 60 tablet 5  . cetirizine (ZYRTEC) 10 MG tablet Take 10 mg by mouth daily.    . fluticasone (FLONASE) 50 MCG/ACT nasal spray Place 2 sprays into both nostrils daily.    . hydrochlorothiazide (HYDRODIURIL) 25 MG tablet Take 1 tablet (25 mg total) by mouth daily. 90 tablet 1  . meloxicam (MOBIC) 15 MG tablet TAKE 1 TABLET BY MOUTH DAILY 90 tablet 1  . metoprolol tartrate (LOPRESSOR) 50 MG tablet Take 1 tablet (50 mg total) by mouth 2 (two) times daily. 180 tablet 1  . traMADol (ULTRAM) 50 MG tablet TAKE 1 TABLET BY MOUTH EVERY 6 HOURS AS NEEDED 60 tablet 3   No current facility-administered medications for this visit.     Review of Systems General: positive for fatigue and malaise, negative for anorexia, chills, fevers, night sweats and weight loss Skin: negative for changes in color, texture, moles or lesions Eyes: negative for, changes in vision, pain, diplopia HEENT: negative for, change in hearing, pain, discharge, tinnitus, vertigo, voice changes, sore throat, neck masses Breasts: Breast exam not performed Pulmonary: negative for, dyspnea, orthopnea, productive cough Cardiac: positive for fatigue, negative for, palpitations, syncope, pain,  discomfort, pressure Gastrointestinal: negative for, dysphagia, nausea, vomiting, jaundice, pain, constipation, diarrhea, hematemesis, hematochezia Genitourinary/Sexual: negative for, dysuria, discharge, hesitancy, nocturia, retention, stones, infections, STD's, incontinence Ob/Gyn: negative for, irregular bleeding, pain Musculoskeletal: positive for arthralgias, back pain and stiff joints, negative for, stiffness, swelling  Hematology: negative for, easy bruising, bleeding Neurologic/Psych: negative for, headaches, seizures, paralysis, weakness, tremor, change in gait, change in sensation, mood swings, change in memory  Objective:  Physical Examination:  BP (!) 156/95   Pulse 74   Temp (!) 97 F (36.1 C) (Tympanic)   Resp 18   Ht 5' 4.75" (1.645 m)   Wt 211 lb 9.6 oz (96 kg)   BMI 35.48 kg/m    ECOG Performance Status: 1 - Symptomatic but completely ambulatory  General appearance: alert, cooperative and appears stated age HEENT:neck supple with midline trachea and thyroid without masses Lymph node survey: non-palpable, axillary, inguinal, supraclavicular Cardiovascular: grade 2 systolic murmur heard  loudest over aorta Respiratory: normal air entry, lungs clear to auscultation and no rales, rhonchi or wheezing Breast exam: breasts appear normal, no suspicious masses, no skin or nipple changes or axillary nodes, not examined. Abdomen: soft, non-tender, without masses or organomegaly, normal bowel sounds, nontender, no hernias and well healed incision Back: inspection of back is normal Extremities: extremities normal, atraumatic, no cyanosis or edema Skin exam - normal coloration and turgor, no rashes, no suspicious skin lesions noted. Neurological exam reveals alert, oriented, normal speech, no focal findings or movement disorder noted. Exam chaperoned by nurse.  EGBUS/Vagina: normal. Vaginal cuff intact. No gross lesions. Uterus/cervix surgically absent.  Bimanual: <1cm thickness  and possible nodule on anterior vaginal wall on exam.  RV exam normal, no masses  Lab Review Labs on site today: CA 125 (07/14/17 ordered by PCP) 7. 10/31/2013- 33.6.   Radiologic Imaging: CT 11/03/2013 IMPRESSION:  1. Endometrial carcinoma appears confined in the uterus.  Specifically, there is no evidence of locally invasive endometrial  carcinoma. There is no evidence of metastatic disease.  2. No acute findings.  3. Chest CT is unremarkable.  4. Small low-density lesion in the liver likely a cyst.  5. There are degenerative changes throughout the visualized spine  most evident in the lower thoracic spine.    Electronically Signed    By: Lajean Manes M.D.    On: 11/03/2013 15:18   MRI LUMBAR SPINE 01/25/2014 IMPRESSION:  1. Relatively mild multilevel degenerative disc disease, greatest at  L4-5 more there is mild spinal stenosis and left greater than right  lateral recess stenosis due to disc and facet disease.  2. Advanced facet arthrosis in the lower lumbar spine. Edema and  enhancement about the left L4-5 facet joint may reflect acute on  chronic facet arthritis.  3. No evidence of metastatic disease in the lumbar spine.    Electronically Signed    By: Logan Bores    On: 01/25/2014 14:49     Assessment:  MAKALEIGH REINARD is a 56 y.o. female diagnosed with stage Ib, grade 2 endometrial adenocarcinoma s/p brachytherapy.   Abdominal and back symptoms are nonspecific but may represent recurrent disease.   Vaginal cuff thickness may represent scarring vs recurrence.   Plan:   Problem List Items Addressed This Visit      Other   FIGO stage II endometrial cancer ([place]) - Primary (Chronic)   Relevant Orders   CT Abdomen Pelvis W Contrast     Given her complaints of abdominal distention, bloating, and fatigue, we will order CT scan of abdomen and pelvis with contrast. Nodularity felt on pelvic exam today Will repeat pelvic exam in 6 weeks to reevaluate and discuss  results of imaging.  Appreciate management of other complaints including back pain and depression, and continued health maintenance by PCP. Could consider referral to GI.   Jennifer Holt, Jennifer Holt 07/21/2017 1:39 PM  I personally reviewed the patient's history, completed key elements of her exam, and was involved in decision making in conjunction with Ms. Treyshaun Keatts.   Jennifer Ends, MD    CC:  Owens Loffler, MD [address] [place], [place] [address] [phone]

## 2017-07-21 NOTE — Progress Notes (Signed)
Back pain that radiates to stomach, abdominal bloating. Right knee pain, in upper back between shoulder pain and radiates down her arms and sometimes numbness rating at 10 and takes meloxicam and tramadol

## 2017-07-26 ENCOUNTER — Telehealth: Payer: Self-pay | Admitting: *Deleted

## 2017-07-26 DIAGNOSIS — E785 Hyperlipidemia, unspecified: Secondary | ICD-10-CM

## 2017-07-26 DIAGNOSIS — Z79899 Other long term (current) drug therapy: Secondary | ICD-10-CM

## 2017-07-26 MED ORDER — ATORVASTATIN CALCIUM 40 MG PO TABS: 40.0000 mg | ORAL_TABLET | Freq: Every day | ORAL | 3 refills | Status: DC

## 2017-07-26 NOTE — Telephone Encounter (Signed)
Left message for Jonni Sanger that Dr. Lorelei Pont had sent Trudi a note on MyChart about her lab results.  She does need to start cholesterol medication. Rx for Lipitor has been sent int [facility] for Lipitor 40 mg.  She will need to return to office in 6 weeks after starting medication for lab only appointment to recheck her cholesterol and liver function.  Future lab orders placed in Epic.

## 2017-07-26 NOTE — Telephone Encounter (Signed)
It appears she did not respond to my note / mychart.   Can you check with her about this?  lipitor 40 mg, 1 po qhs, #30, 3 ref  F/u Lipid and LFT's in 6 weeks FLP, E78.5  HFP: Z79.899

## 2017-07-26 NOTE — Telephone Encounter (Signed)
Jonni Sanger advised me today at his appointment that Jennifer Holt was seen by her GYN Oncologist and  she was advised to contact her PCP because her cholesterol was high and she needs to be started on at cholesterol medication.

## 2017-07-27 ENCOUNTER — Ambulatory Visit
Admission: RE | Admit: 2017-07-27 | Discharge: 2017-07-27 | Disposition: A | Discharge status: Home / Self Care | Payer: Medicare Other | Source: Ambulatory Visit | Attending: Nurse Practitioner | Admitting: Nurse Practitioner

## 2017-07-27 DIAGNOSIS — R140 Abdominal distension (gaseous): Secondary | ICD-10-CM | POA: Diagnosis not present

## 2017-07-27 DIAGNOSIS — K439 Ventral hernia without obstruction or gangrene: Secondary | ICD-10-CM | POA: Diagnosis not present

## 2017-07-27 DIAGNOSIS — K769 Liver disease, unspecified: Secondary | ICD-10-CM | POA: Diagnosis not present

## 2017-07-27 DIAGNOSIS — I700 Atherosclerosis of aorta: Secondary | ICD-10-CM | POA: Insufficient documentation

## 2017-07-27 DIAGNOSIS — C541 Malignant neoplasm of endometrium: Secondary | ICD-10-CM | POA: Diagnosis present

## 2017-07-27 DIAGNOSIS — I517 Cardiomegaly: Secondary | ICD-10-CM | POA: Insufficient documentation

## 2017-07-27 MED ORDER — IOPAMIDOL (ISOVUE-300) INJECTION 61%
100.0000 mL | Freq: Once | INTRAVENOUS | Status: AC | PRN
  Administered 2017-07-27: 100 mL via INTRAVENOUS

## 2017-07-29 ENCOUNTER — Telehealth: Payer: Self-pay | Admitting: *Deleted

## 2017-07-29 NOTE — Telephone Encounter (Signed)
Patient called asking for [place] to return her call. She had a  CT on 10/30, Not sure if it is in regards to that or ot. [phone]

## 2017-07-30 ENCOUNTER — Ambulatory Visit: Payer: Medicare Other | Admitting: Gynecology

## 2017-07-30 ENCOUNTER — Telehealth: Payer: Self-pay

## 2017-07-30 NOTE — Telephone Encounter (Signed)
  Oncology Nurse Navigator Documentation Called and notified Ms. Jennifer Holt of CT results. Instructed to keep follow up appointment as scheduled.  IMPRESSION: 1. No explanation for the patient's abdominal distention and discomfort is seen. The appendix and terminal ileum appear normal. 2. Moderate cardiomegaly. Moderate abdominal aortic atherosclerosis. 3. Small midline abdominal wall hernia just above the umbilicus containing only fat. 4. Question of mild fatty infiltration of the liver.      Navigator Location: [facility] (07/30/17 1000)   )Navigator Encounter Type: Telephone;Diagnostic Results (07/30/17 1000) Telephone: Outgoing Call;Diagnostic Results (07/30/17 1000)                   Patient Visit Type: GynOnc (07/30/17 1000)                              Time Spent with Patient: 15 (07/30/17 1000)

## 2017-08-30 ENCOUNTER — Other Ambulatory Visit: Payer: Self-pay | Admitting: Family Medicine

## 2017-08-30 NOTE — Telephone Encounter (Signed)
Last office visit 07/14/2017.  Last refilled 12/10/2016 fir #60 with 3 refills.  Ok to refill?

## 2017-08-30 NOTE — Telephone Encounter (Signed)
Tramadol called into [place], [place] [facility] [place]

## 2017-08-30 NOTE — Telephone Encounter (Signed)
Ok, #60, 3 ref

## 2017-09-08 ENCOUNTER — Inpatient Hospital Stay: Payer: Medicare Other | Attending: Obstetrics and Gynecology | Admitting: Obstetrics and Gynecology

## 2017-09-08 ENCOUNTER — Ambulatory Visit: Payer: Medicare Other

## 2017-09-08 VITALS — BP 138/87 | HR 77 | Temp 98.8°F | Resp 18 | Ht 64.75 in | Wt 213.6 lb

## 2017-09-08 DIAGNOSIS — Z90710 Acquired absence of both cervix and uterus: Secondary | ICD-10-CM

## 2017-09-08 DIAGNOSIS — I10 Essential (primary) hypertension: Secondary | ICD-10-CM | POA: Diagnosis not present

## 2017-09-08 DIAGNOSIS — E785 Hyperlipidemia, unspecified: Secondary | ICD-10-CM | POA: Diagnosis not present

## 2017-09-08 DIAGNOSIS — M1990 Unspecified osteoarthritis, unspecified site: Secondary | ICD-10-CM | POA: Insufficient documentation

## 2017-09-08 DIAGNOSIS — I700 Atherosclerosis of aorta: Secondary | ICD-10-CM | POA: Insufficient documentation

## 2017-09-08 DIAGNOSIS — Z8542 Personal history of malignant neoplasm of other parts of uterus: Secondary | ICD-10-CM | POA: Diagnosis not present

## 2017-09-08 DIAGNOSIS — Z90722 Acquired absence of ovaries, bilateral: Secondary | ICD-10-CM | POA: Insufficient documentation

## 2017-09-08 DIAGNOSIS — Z923 Personal history of irradiation: Secondary | ICD-10-CM

## 2017-09-08 DIAGNOSIS — K219 Gastro-esophageal reflux disease without esophagitis: Secondary | ICD-10-CM | POA: Insufficient documentation

## 2017-09-08 DIAGNOSIS — C541 Malignant neoplasm of endometrium: Secondary | ICD-10-CM

## 2017-09-08 DIAGNOSIS — I517 Cardiomegaly: Secondary | ICD-10-CM | POA: Insufficient documentation

## 2017-09-08 DIAGNOSIS — Z79899 Other long term (current) drug therapy: Secondary | ICD-10-CM | POA: Diagnosis not present

## 2017-09-08 DIAGNOSIS — M5136 Other intervertebral disc degeneration, lumbar region: Secondary | ICD-10-CM | POA: Diagnosis not present

## 2017-09-08 DIAGNOSIS — K439 Ventral hernia without obstruction or gangrene: Secondary | ICD-10-CM | POA: Diagnosis not present

## 2017-09-08 NOTE — Progress Notes (Signed)
Gynecologic Oncology Interval Visit   Referring Provider: Owens Loffler, MD [address] [place], [place] [address] [phone]   Chief Concern: surveillance endometrial cancer   Subjective:  Jennifer Holt is a 56 y.o. female who is seen in consultation from Dr. Lorelei Pont for surveillance for endometrial cancer. She presents today for repeat exam to follow up  possible nodule on anterior vaginal wall on exam.  CT scan 07/27/2017 IMPRESSION: 1. No explanation for the patient's abdominal distention and discomfort is seen. The appendix and terminal ileum appear normal. 2. Moderate cardiomegaly. Moderate abdominal aortic atherosclerosis. 3. Small midline abdominal wall hernia just above the umbilicus containing only fat. 4. Question of mild fatty infiltration of the liver.   Gynecologic Oncology Jennifer Holt is a pleasant female who is seen in consultation from Dr. Lorelei Pont for surveillance for endometrial cancer. 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 Aortic L M.D., omental biopsy and washings performed to 11/23/2013. She underwent adjuvant radiotherapy with vaginal brachytherapy for stage Ib, grade 2, positive cytology endometrial cancer. Tumor size was reported as 6 x 3.2 x 1.6 cm. No LVSI and without other evidence of extrauterine disease.  Patient was lost to follow-up due to lack of insurance coverage and returns to clinic today for surveillance. She has established primary care with Dr. Lorelei Pont. Patient has multiple complaints including fatigue epigastric pain and bloating with burping and flatulence, worsened by large meals. Patient believes she had a colonoscopy in 2011 that was normal and does not regularly see GI. She has a history of chronic back pain. Review of MRI from 01/25/2014 shows evidence of degenerative disc disease most significantly at L4-L5. She reports that this pain is unchanged.   CT scan from  08/17/2014 showed 6 mm hypodensity in the right lobe of the liver posteriorly that was consistent with previously exams, stable,benign in appearance. No evidence of disease on CT.   Problem List: Patient Active Problem List   Diagnosis Date Noted  . Hypertension   . Hyperlipidemia   . Allergic rhinitis due to pollen   . GERD (gastroesophageal reflux disease)   . Major depressive disorder, recurrent episode with anxious distress ([place])   . Panic attacks   . Osteoarthritis, multiple sites   . FIGO stage II endometrial cancer [facility])     Past Medical History: Past Medical History:  Diagnosis Date  . Allergic rhinitis due to pollen   . Blood transfusion without reported diagnosis   . FIGO stage II endometrial cancer [facility])    [facility]. + malignant cells in peritoneal fluid.   Marland Kitchen GERD (gastroesophageal reflux disease)   . Hyperlipidemia   . Hypertension   . Major depressive disorder, recurrent episode with anxious distress ([place])   . Osteoarthritis, multiple sites   . Panic attacks   . Uterine cancer ([place]) 2015    Past Surgical History: Past Surgical History:  Procedure Laterality Date  . ABDOMINAL HYSTERECTOMY    . TONSILLECTOMY    . TONSILLECTOMY AND ADENOIDECTOMY    . TOTAL ABDOMINAL HYSTERECTOMY W/ BILATERAL SALPINGOOPHORECTOMY     BSO    Past Gynecologic History:  Menarche: No longer menstruates  History of OCP/HRT use: No use of HRT History of Abnormal pap: yes, per gyn hx; pt unsure of findings Last pap: per gyn hx History of STDs: The patient denies history of sexually transmitted disease. Contraception: TAH BSO Sexually active: yes  OB History:  OB History  No data  available  Gravida 2 Para 2  Family History: Family History  Problem Relation Age of Onset  . Arthritis Mother   . Hyperlipidemia Mother   . Stroke Mother   . Hypertension Mother   . Mental illness Mother   . Hyperlipidemia Father   . Heart disease Father   . Hypertension Father   .  Mental illness Brother   . Stroke Maternal Grandmother   . Hypertension Maternal Grandmother   . Mental illness Brother   . Breast cancer Sister   . Breast cancer Paternal Aunt   . Cancer Paternal Aunt    Social History: Social History   Socioeconomic History  . Marital status: Divorced    Spouse name: Not on file  . Number of children: Not on file  . Years of education: Not on file  . Highest education level: Not on file  Social Needs  . Financial resource strain: Not on file  . Food insecurity - worry: Not on file  . Food insecurity - inability: Not on file  . Transportation needs - medical: Not on file  . Transportation needs - non-medical: Not on file  Occupational History  . Not on file  Tobacco Use  . Smoking status: Never Smoker  . Smokeless tobacco: Never Used  Substance and Sexual Activity  . Alcohol use: Yes    Alcohol/week: 1.8 - 2.4 oz    Types: 3 - 4 Standard drinks or equivalent per week    Comment: wine or liquor once a week  . Drug use: No  . Sexual activity: Yes    Birth control/protection: Surgical  Other Topics Concern  . Not on file  Social History Narrative  . Not on file   Allergies: Allergies  Allergen Reactions  . Ace Inhibitors Swelling    Angioedema.  . Prednisone Other (See Comments)    Flu like symptoms   Current Medications: Current Outpatient Medications  Medication Sig Dispense Refill  . ALPRAZolam (XANAX) 0.5 MG tablet Take 1 tablet (0.5 mg total) by mouth 3 (three) times daily as needed. 90 tablet 2  . atorvastatin (LIPITOR) 40 MG tablet Take 1 tablet (40 mg total) by mouth at bedtime. 30 tablet 3  . buPROPion (WELLBUTRIN SR) 150 MG 12 hr tablet Take 1 tablet (150 mg total) by mouth 2 (two) times daily. 60 tablet 5  . cetirizine (ZYRTEC) 10 MG tablet Take 10 mg by mouth daily as needed.     . fluticasone (FLONASE) 50 MCG/ACT nasal spray Place 2 sprays into both nostrils daily as needed.     . hydrochlorothiazide (HYDRODIURIL)  25 MG tablet Take 1 tablet (25 mg total) by mouth daily. 90 tablet 1  . meloxicam (MOBIC) 15 MG tablet TAKE 1 TABLET BY MOUTH DAILY 90 tablet 1  . metoprolol tartrate (LOPRESSOR) 50 MG tablet Take 1 tablet (50 mg total) by mouth 2 (two) times daily. 180 tablet 1  . traMADol (ULTRAM) 50 MG tablet TAKE 1 TABLET BY MOUTH EVERY 6 HOURS AS NEEDED 60 tablet 3   No current facility-administered medications for this visit.     Review of Systems   General: positive for chronic fatigue and malaise. Negative for anorexia, chills, fevers, night sweats, and/or weight loss.   HEENT: negative for changes in vision, pain, or diplopia. Negative for changes in hearing, pain, discharge, tinnitus, vertigo, voice changes, sore throat, neck masses  Lungs: no sob. Negative for dyspnea, orthopnea, or productive cough  Cardiac: 'racing heart' on exertion. Positive  for fatigue. Negative for pain, discomfort, or pressure  GI: abdominal bloating, LUQ discomfort, generalized abdominal pain.   GU: no complaints  Musculoskeletal: chronic back pain  Extremities: no complaints  Skin: negative for changes in color, textures, new moles or lesions  Neuro: no complaints  Endocrine: no complaints  Psych: no complaints       Objective:  Physical Examination:  BP 138/87   Pulse 77   Temp 98.8 F (37.1 C) (Tympanic)   Resp 18   Ht 5' 4.75" (1.645 m)   Wt 213 lb 9.6 oz (96.9 kg)   BMI 35.82 kg/m    ECOG Performance Status: 1 - Symptomatic but completely ambulatory  Exam chaperoned by NP EGBUS/Vagina: normal. Vaginal cuff intact. No gross lesions. Bimanual: <1cm thickness and possible nodule on anterior vaginal wall on exam which is stable.    Lab Review Labs on site today: CA 125 (07/14/17 ordered by PCP) 7. 10/31/2013- 33.6.   Radiologic Imaging: CT 11/03/2013 IMPRESSION:  1. Endometrial carcinoma appears confined in the uterus.  Specifically, there is no evidence of locally invasive endometrial  carcinoma.  There is no evidence of metastatic disease.  2. No acute findings.  3. Chest CT is unremarkable.  4. Small low-density lesion in the liver likely a cyst.  5. There are degenerative changes throughout the visualized spine  most evident in the lower thoracic spine.    Electronically Signed    By: Lajean Manes M.D.    On: 11/03/2013 15:18   MRI LUMBAR SPINE 01/25/2014 IMPRESSION:  1. Relatively mild multilevel degenerative disc disease, greatest at  L4-5 more there is mild spinal stenosis and left greater than right  lateral recess stenosis due to disc and facet disease.  2. Advanced facet arthrosis in the lower lumbar spine. Edema and  enhancement about the left L4-5 facet joint may reflect acute on  chronic facet arthritis.  3. No evidence of metastatic disease in the lumbar spine.    Electronically Signed    By: Logan Bores    On: 01/25/2014 14:49     Assessment:  Jennifer Holt is a 56 y.o. female diagnosed with stage Ib, grade 2 endometrial adenocarcinoma s/p brachytherapy.   Abdominal and back symptoms are nonspecific, CT scan negative for recurrence.   Vaginal cuff thickness may represent scarring and stable on exam.   Plan:   Problem List Items Addressed This Visit      Genitourinary   RESOLVED: Endometrial cancer ([place]) - Primary     Other   FIGO stage II endometrial cancer ([place]) (Chronic)      Follow up 6 months for continued surveillance.  Given her complaints of abdominal distention, bloating, and fatigue and negative CT scan we urged her to follow up with her PCP. If uncertain diagnosis can consider GI consult.    Beckey Rutter, DNP AGNP-C  I personally reviewed the patient's history, completed key elements of her exam, and was involved in decision making in conjunction with Ms. Beckey Rutter.   Gillis Ends, MD    CC:  Owens Loffler, MD [address] [place], [place] [address] [phone]

## 2017-09-08 NOTE — Progress Notes (Signed)
Back still hurts her and she has arthritis. Fatigue is continued. Last time pt was seen md had ct scan ordered and suggested that pt go see Gi.

## 2017-09-15 ENCOUNTER — Encounter: Payer: Self-pay | Admitting: Family Medicine

## 2017-09-15 ENCOUNTER — Other Ambulatory Visit: Payer: Self-pay

## 2017-09-15 ENCOUNTER — Ambulatory Visit (INDEPENDENT_AMBULATORY_CARE_PROVIDER_SITE_OTHER): Payer: Medicare Other | Admitting: Family Medicine

## 2017-09-15 VITALS — BP 120/70 | HR 70 | Temp 97.3°F | Ht 64.75 in | Wt 215.0 lb

## 2017-09-15 DIAGNOSIS — I10 Essential (primary) hypertension: Secondary | ICD-10-CM | POA: Diagnosis not present

## 2017-09-15 DIAGNOSIS — F410 Panic disorder [episodic paroxysmal anxiety] without agoraphobia: Secondary | ICD-10-CM | POA: Diagnosis not present

## 2017-09-15 DIAGNOSIS — E782 Mixed hyperlipidemia: Secondary | ICD-10-CM

## 2017-09-15 DIAGNOSIS — F339 Major depressive disorder, recurrent, unspecified: Secondary | ICD-10-CM

## 2017-09-15 DIAGNOSIS — I517 Cardiomegaly: Secondary | ICD-10-CM | POA: Diagnosis not present

## 2017-09-15 DIAGNOSIS — M158 Other polyosteoarthritis: Secondary | ICD-10-CM | POA: Diagnosis not present

## 2017-09-15 NOTE — Patient Instructions (Signed)

## 2017-09-15 NOTE — Progress Notes (Signed)
Dr. Frederico Hamman T. Denim Start, MD, [facility] [place], [address] Phone: [phone] Fax: [phone]  09/15/2017  Patient: Jennifer Holt, MRN: [id number], DOB: 1961-06-20, 56 y.o.  Primary Physician:  Owens Loffler, MD   Chief Complaint  Patient presents with  . Follow-up    HTN & Anxiety   Subjective:   Jennifer Holt is a 56 y.o. very pleasant female patient who presents with the following:  HTN: Tolerating all medications without side effects Stable and at goal No CP, no sob. No HA.  BP Readings from Last 3 Encounters:  09/15/17 120/70  09/08/17 138/87  07/21/17 (!) 865/78    Basic Metabolic Panel:    Component Value Date/Time   NA 138 07/14/2017 0925   NA 137 11/07/2013 1615   K 5.0 07/14/2017 0925   K 4.1 11/07/2013 1615   CL 101 07/14/2017 0925   CL 105 11/07/2013 1615   CO2 27 07/14/2017 0925   CO2 28 11/07/2013 1615   BUN 22 07/14/2017 0925   BUN 18 11/07/2013 1615   CREATININE 0.88 07/14/2017 0925   CREATININE 0.92 08/15/2014 0925   GLUCOSE 119 (H) 07/14/2017 0925   GLUCOSE 95 11/07/2013 1615   CALCIUM 10.1 07/14/2017 0925   CALCIUM 9.7 11/07/2013 1615    Feeling a whole lot better now and BP.   Has not started wellbutrin.   Occ will get some pain in both of her shoulder blades. Down her arms. Once -  Xanax will improve the symptoms.  Sometimes heart will race - better with   She also brings up a well the other complaints including various arthritic aches and pains, and ultimately I could not answer them adequately   In our allotted time.  She also had moderate cardiomegaly on recent CT scan.  Most recent echocardiogram is 2014.  Past Medical History, Surgical History, Social History, Family History, Problem List, Medications, and Allergies have been reviewed and updated if relevant.  Patient Active Problem List   Diagnosis Date Noted  . FIGO stage II endometrial cancer ([place])      Priority: High  . Hypertension   . Hyperlipidemia   . Allergic rhinitis due to pollen   . GERD (gastroesophageal reflux disease)   . Major depressive disorder, recurrent episode with anxious distress ([place])   . Panic attacks   . Osteoarthritis, multiple sites     Past Medical History:  Diagnosis Date  . Allergic rhinitis due to pollen   . Blood transfusion without reported diagnosis   . FIGO stage II endometrial cancer [facility])    [facility]. + malignant cells in peritoneal fluid.   Marland Kitchen GERD (gastroesophageal reflux disease)   . Hyperlipidemia   . Hypertension   . Major depressive disorder, recurrent episode with anxious distress ([place])   . Osteoarthritis, multiple sites   . Panic attacks   . Uterine cancer ([place]) 2015    Past Surgical History:  Procedure Laterality Date  . ABDOMINAL HYSTERECTOMY    . TONSILLECTOMY    . TONSILLECTOMY AND ADENOIDECTOMY    . TOTAL ABDOMINAL HYSTERECTOMY W/ BILATERAL SALPINGOOPHORECTOMY     BSO    Social History   Socioeconomic History  . Marital status: Divorced    Spouse name: Not on file  . Number of children: Not on file  . Years of education: Not on file  . Highest education level: Not on file  Social Needs  . Emergency planning/management officer  strain: Not on file  . Food insecurity - worry: Not on file  . Food insecurity - inability: Not on file  . Transportation needs - medical: Not on file  . Transportation needs - non-medical: Not on file  Occupational History  . Not on file  Tobacco Use  . Smoking status: Never Smoker  . Smokeless tobacco: Never Used  Substance and Sexual Activity  . Alcohol use: Yes    Alcohol/week: 1.8 - 2.4 oz    Types: 3 - 4 Standard drinks or equivalent per week    Comment: wine or liquor once a week  . Drug use: No  . Sexual activity: Yes    Birth control/protection: Surgical  Other Topics Concern  . Not on file  Social History Narrative  . Not on file    Family History  Problem Relation Age of Onset    . Arthritis Mother   . Hyperlipidemia Mother   . Stroke Mother   . Hypertension Mother   . Mental illness Mother   . Hyperlipidemia Father   . Heart disease Father   . Hypertension Father   . Mental illness Brother   . Stroke Maternal Grandmother   . Hypertension Maternal Grandmother   . Mental illness Brother   . Breast cancer Sister   . Breast cancer Paternal Aunt   . Cancer Paternal Aunt     Allergies  Allergen Reactions  . Ace Inhibitors Swelling    Angioedema.  . Prednisone Other (See Comments)    Flu like symptoms    Medication list reviewed and updated in full in [place].   GEN: No acute illnesses, no fevers, chills. GI: No n/v/d, eating normally Pulm: No SOB Interactive and getting along well at home.  Otherwise, ROS is as per the HPI.  Objective:   BP 120/70   Pulse 70   Temp (!) 97.3 F (36.3 C) (Oral)   Ht 5' 4.75" (1.645 m)   Wt 215 lb (97.5 kg)   BMI 36.05 kg/m   GEN: WDWN, NAD, Non-toxic, A & O x 3 HEENT: Atraumatic, Normocephalic. Neck supple. No masses, No LAD. Ears and Nose: No external deformity. CV: RRR, No M/G/R. No JVD. No thrill. No extra heart sounds. PULM: CTA B, no wheezes, crackles, rhonchi. No retractions. No resp. distress. No accessory muscle use. EXTR: No c/c/e NEURO Normal gait.  PSYCH: Normally interactive. Conversant. Not depressed or anxious appearing.  Calm demeanor.   Laboratory and Imaging Data: Results for orders placed or performed in visit on 07/14/17  Lipid panel  Result Value Ref Range   Cholesterol 305 (H) 0 - 200 mg/dL   Triglycerides 328.0 (H) 0.0 - 149.0 mg/dL   HDL 47.40 >39.00 mg/dL   VLDL 65.6 (H) 0.0 - 40.0 mg/dL   Total CHOL/HDL Ratio 6    NonHDL 629.52   Basic metabolic panel  Result Value Ref Range   Sodium 138 135 - 145 mEq/L   Potassium 5.0 3.5 - 5.1 mEq/L   Chloride 101 96 - 112 mEq/L   CO2 27 19 - 32 mEq/L   Glucose, Bld 119 (H) 70 - 99 mg/dL   BUN 22 6 - 23 mg/dL   Creatinine,  Ser 0.88 0.40 - 1.20 mg/dL   Calcium 10.1 8.4 - 10.5 mg/dL   GFR 70.05 >60.00 mL/min  CBC with Differential/Platelet  Result Value Ref Range   WBC 4.8 4.0 - 10.5 K/uL   RBC 4.06 3.87 - 5.11 Mil/uL  Hemoglobin 12.8 12.0 - 15.0 g/dL   HCT 38.9 36.0 - 46.0 %   MCV 95.7 78.0 - 100.0 fl   MCHC 33.0 30.0 - 36.0 g/dL   RDW 12.2 11.5 - 15.5 %   Platelets 276.0 150.0 - 400.0 K/uL   Neutrophils Relative % 65.1 43.0 - 77.0 %   Lymphocytes Relative 24.3 12.0 - 46.0 %   Monocytes Relative 8.2 3.0 - 12.0 %   Eosinophils Relative 1.6 0.0 - 5.0 %   Basophils Relative 0.8 0.0 - 3.0 %   Neutro Abs 3.1 1.4 - 7.7 K/uL   Lymphs Abs 1.2 0.7 - 4.0 K/uL   Monocytes Absolute 0.4 0.1 - 1.0 K/uL   Eosinophils Absolute 0.1 0.0 - 0.7 K/uL   Basophils Absolute 0.0 0.0 - 0.1 K/uL  Hepatic function panel  Result Value Ref Range   Total Bilirubin 0.6 0.2 - 1.2 mg/dL   Bilirubin, Direct 0.0 0.0 - 0.3 mg/dL   Alkaline Phosphatase 70 39 - 117 U/L   AST 19 0 - 37 U/L   ALT 21 0 - 35 U/L   Total Protein 7.5 6.0 - 8.3 g/dL   Albumin 4.5 3.5 - 5.2 g/dL  Hemoglobin A1c  Result Value Ref Range   Hgb A1c MFr Bld 5.7 4.6 - 6.5 %  TSH  Result Value Ref Range   TSH 2.32 0.35 - 4.50 uIU/mL  VITAMIN D 25 Hydroxy (Vit-D Deficiency, Fractures)  Result Value Ref Range   VITD 42.72 30.00 - 100.00 ng/mL  CA 125  Result Value Ref Range   CA 125 7 <35 U/mL  LDL cholesterol, direct  Result Value Ref Range   Direct LDL 195.0 mg/dL    Ct Abdomen Pelvis W Contrast  Result Date: 07/27/2017 CLINICAL DATA:  Generalized abdominal discomfort, bloating over the last year, history of endometrial carcinoma diagnosed in 2015 with total hysterectomy EXAM: CT ABDOMEN AND PELVIS WITH CONTRAST TECHNIQUE: Multidetector CT imaging of the abdomen and pelvis was performed using the standard protocol following bolus administration of intravenous contrast. CONTRAST:  139mL ISOVUE-300 IOPAMIDOL (ISOVUE-300) INJECTION 61% COMPARISON:  CT  abdomen pelvis of 08/17/2014 FINDINGS: Lower chest: The lung bases are clear. Moderate cardiomegaly is again noted. No pericardial effusion is seen. Hepatobiliary: The liver appears somewhat low in attenuation and fatty infiltration is a consideration. No focal hepatic abnormality is seen, and no calcified gallstones are noted. Pancreas: The pancreas is normal in size and the pancreatic duct is not dilated. Spleen: The spleen is unremarkable. Adrenals/Urinary Tract: The adrenal glands appear normal. The kidneys enhance with no renal calculus and no hydronephrosis is noted. On delayed images, the pelvocaliceal systems are unremarkable. The ureters appear normal in caliber. The urinary bladder is not well distended but no abnormality is seen. Stomach/Bowel: The stomach is moderately distended with oral contrast media. No abnormality is noted. The small bowel is normal in caliber. The colon is largely decompressed. No colonic lesion is evident. The terminal ileum is unremarkable. The appendix fills with air and is unremarkable as well. No inflammatory process is noted. Vascular/Lymphatic: The abdominal aorta is normal in caliber with moderate abdominal aortic atherosclerosis present. No adenopathy is seen. Reproductive: The uterus has been resected. No adnexal lesion is seen. No fluid is noted within the pelvis. Other: The small midline hernia is present just above the umbilicus containing only fat. This has minimally increased in size compared to the prior CT. Musculoskeletal: The lumbar vertebrae are in normal alignment. There are extensive degenerative  changes within the facet joints of the lower lumbar spine. Intervertebral disc spaces appear normal. IMPRESSION: 1. No explanation for the patient's abdominal distention and discomfort is seen. The appendix and terminal ileum appear normal. 2. Moderate cardiomegaly. Moderate abdominal aortic atherosclerosis. 3. Small midline abdominal wall hernia just above the  umbilicus containing only fat. 4. Question of mild fatty infiltration of the liver. Electronically Signed   By: Ivar Drape M.D.   On: 07/27/2017 16:23   Assessment and Plan:   Essential hypertension  Cardiomegaly - Plan: ECHOCARDIOGRAM COMPLETE  Panic attacks  Major depressive disorder, recurrent episode with anxious distress (HCC)  Mixed hyperlipidemia  Other osteoarthritis involving multiple joints  Doing reasonably well.  Has reestablished with oncology.  Hypertension is better controlled.  Depression and panic attacks are better controlled, but not fully controlled.  Moderate cardiomegaly on CT of the abdomen and pelvis.  Follow-up with echocardiogram, complete.  Follow-up: Return in about 6 months (around 03/16/2018).  Future Appointments  Date Time Provider [place]  09/27/2017 10:30 AM [facility] [place] [facility]  03/09/2018  2:30 PM [facility] None  03/16/2018  9:30 AM Kyree Adriano, Frederico Hamman, MD [facility]   Orders Placed This Encounter  Procedures  . ECHOCARDIOGRAM COMPLETE    Signed,  Frederico Hamman T. Chetara Kropp, MD   Allergies as of 09/15/2017      Reactions   Ace Inhibitors Swelling   Angioedema.   Prednisone Other (See Comments)   Flu like symptoms      Medication List        Accurate as of 09/15/17  5:26 PM. Always use your most recent med list.          ALPRAZolam 0.5 MG tablet Commonly known as:  XANAX Take 1 tablet (0.5 mg total) by mouth 3 (three) times daily as needed.   atorvastatin 40 MG tablet Commonly known as:  LIPITOR Take 1 tablet (40 mg total) by mouth at bedtime.   buPROPion 150 MG 12 hr tablet Commonly known as:  WELLBUTRIN SR Take 1 tablet (150 mg total) by mouth 2 (two) times daily.   cetirizine 10 MG tablet Commonly known as:  ZYRTEC Take 10 mg by mouth daily as needed.   fluticasone 50 MCG/ACT nasal spray Commonly known as:  FLONASE Place 2 sprays into both nostrils daily as needed.     hydrochlorothiazide 25 MG tablet Commonly known as:  HYDRODIURIL Take 1 tablet (25 mg total) by mouth daily.   meloxicam 15 MG tablet Commonly known as:  MOBIC TAKE 1 TABLET BY MOUTH DAILY   metoprolol tartrate 50 MG tablet Commonly known as:  LOPRESSOR Take 1 tablet (50 mg total) by mouth 2 (two) times daily.   traMADol 50 MG tablet Commonly known as:  ULTRAM TAKE 1 TABLET BY MOUTH EVERY 6 HOURS AS NEEDED

## 2017-09-27 ENCOUNTER — Other Ambulatory Visit: Payer: Self-pay

## 2017-09-27 ENCOUNTER — Ambulatory Visit (INDEPENDENT_AMBULATORY_CARE_PROVIDER_SITE_OTHER): Payer: Medicare Other

## 2017-09-27 DIAGNOSIS — I517 Cardiomegaly: Secondary | ICD-10-CM

## 2017-09-29 ENCOUNTER — Other Ambulatory Visit: Payer: Self-pay | Admitting: *Deleted

## 2017-09-29 MED ORDER — ALPRAZOLAM 0.5 MG PO TABS: 0.5000 mg | ORAL_TABLET | Freq: Three times a day (TID) | ORAL | 5 refills | Status: DC | PRN

## 2017-09-29 NOTE — Telephone Encounter (Signed)
Ok to refill #90, 5 refills  I attempted to e-prescribe but received a error / failed message.

## 2017-09-29 NOTE — Telephone Encounter (Signed)
Last office visit 09/15/2017.  Last refilled 06/30/2017 for #90 with 2 refills.  Ok to refill?

## 2017-09-29 NOTE — Telephone Encounter (Signed)
Alprazolam called into [facility] [place] [facility] [place]

## 2017-10-18 ENCOUNTER — Other Ambulatory Visit: Payer: Self-pay | Admitting: Family Medicine

## 2017-10-18 DIAGNOSIS — I10 Essential (primary) hypertension: Secondary | ICD-10-CM

## 2017-10-27 ENCOUNTER — Other Ambulatory Visit: Payer: Self-pay | Admitting: Family Medicine

## 2017-10-27 MED ORDER — HYDROCHLOROTHIAZIDE 25 MG PO TABS: 25.0000 mg | ORAL_TABLET | Freq: Every day | ORAL | 1 refills | Status: DC

## 2017-10-27 NOTE — Addendum Note (Signed)
Addended by: Carter Kitten on: 10/27/2017 11:39 AM   Modules accepted: Orders

## 2017-10-27 NOTE — Telephone Encounter (Signed)
Last office visit 09/15/2017.  Last refilled 12/10/2016 for #90 with 1 refill.  Ok to refill?

## 2017-11-29 ENCOUNTER — Other Ambulatory Visit: Payer: Self-pay | Admitting: Family Medicine

## 2017-11-29 NOTE — Telephone Encounter (Signed)
Last office visit 09/15/2017.  Last refilled 08/30/2017 for #60 with 3 refills.  Ok to refill?

## 2018-02-04 ENCOUNTER — Other Ambulatory Visit: Payer: Self-pay | Admitting: Family Medicine

## 2018-02-04 DIAGNOSIS — F339 Major depressive disorder, recurrent, unspecified: Secondary | ICD-10-CM

## 2018-02-04 DIAGNOSIS — F410 Panic disorder [episodic paroxysmal anxiety] without agoraphobia: Secondary | ICD-10-CM

## 2018-03-09 ENCOUNTER — Inpatient Hospital Stay: Payer: Medicare Other | Attending: Obstetrics and Gynecology

## 2018-03-09 NOTE — Progress Notes (Deleted)
Gynecologic Oncology Interval Visit   Referring Provider: Owens Loffler, MD [address] [place], [place] [address] [phone]   Chief Concern: surveillance endometrial cancer   Subjective:  Jennifer Holt is a 57 y.o. female who is seen in consultation from Dr. Lorelei Holt for surveillance for endometrial cancer. She presents today for repeat exam to follow up  possible nodule on anterior vaginal wall on exam.     Gynecologic Oncology Jennifer Holt is a pleasant female who is seen in consultation from Dr. Lorelei Holt for surveillance for endometrial cancer. 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 Aortic L M.D., omental biopsy and washings performed to 11/23/2013. She underwent adjuvant radiotherapy with vaginal brachytherapy for stage Ib, grade 2, positive cytology endometrial cancer. Tumor size was reported as 6 x 3.2 x 1.6 cm. No LVSI and without other evidence of extrauterine disease.  Patient was lost to follow-up due to lack of insurance coverage and returns to clinic today for surveillance. She has established primary care with Dr. Lorelei Holt. Patient has multiple complaints including fatigue epigastric pain and bloating with burping and flatulence, worsened by large meals. Patient believes she had a colonoscopy in 2011 that was normal and does not regularly see GI. She has a history of chronic back pain. Review of MRI from 01/25/2014 shows evidence of degenerative disc disease most significantly at L4-L5. She reports that this pain is unchanged.   CT scan from 08/17/2014 showed 6 mm hypodensity in the right lobe of the liver posteriorly that was consistent with previously exams, stable,benign in appearance. No evidence of disease on CT.  CT scan 07/27/2017 IMPRESSION: 1. No explanation for the patient's abdominal distention and discomfort is seen. The appendix and terminal ileum appear normal. 2. Moderate cardiomegaly.  Moderate abdominal aortic atherosclerosis. 3. Small midline abdominal wall hernia just above the umbilicus containing only fat. 4. Question of mild fatty infiltration of the liver.  Problem List: Patient Active Problem List   Diagnosis Date Noted  . Hypertension   . Hyperlipidemia   . Allergic rhinitis due to pollen   . GERD (gastroesophageal reflux disease)   . Major depressive disorder, recurrent episode with anxious distress ([place])   . Panic attacks   . Osteoarthritis, multiple sites   . FIGO stage II endometrial cancer [facility])     Past Medical History: Past Medical History:  Diagnosis Date  . Allergic rhinitis due to pollen   . Blood transfusion without reported diagnosis   . FIGO stage II endometrial cancer [facility])    [facility]. + malignant cells in peritoneal fluid.   Marland Kitchen GERD (gastroesophageal reflux disease)   . Hyperlipidemia   . Hypertension   . Major depressive disorder, recurrent episode with anxious distress ([place])   . Osteoarthritis, multiple sites   . Panic attacks   . Uterine cancer ([place]) 2015    Past Surgical History: Past Surgical History:  Procedure Laterality Date  . ABDOMINAL HYSTERECTOMY    . TONSILLECTOMY    . TONSILLECTOMY AND ADENOIDECTOMY    . TOTAL ABDOMINAL HYSTERECTOMY W/ BILATERAL SALPINGOOPHORECTOMY     BSO    Past Gynecologic History:  Menarche: No longer menstruates  History of OCP/HRT use: No use of HRT History of Abnormal pap: yes, per gyn hx; pt unsure of findings Last pap: per gyn hx History of STDs: The patient denies history of sexually transmitted disease. Contraception: TAH BSO Sexually active: yes  OB History:  OB History  No  data available  Gravida 2 Para 2  Family History: Family History  Problem Relation Age of Onset  . Arthritis Mother   . Hyperlipidemia Mother   . Stroke Mother   . Hypertension Mother   . Mental illness Mother   . Hyperlipidemia Father   . Heart disease Father   . Hypertension Father   .  Mental illness Brother   . Stroke Maternal Grandmother   . Hypertension Maternal Grandmother   . Mental illness Brother   . Breast cancer Sister   . Breast cancer Paternal Aunt   . Cancer Paternal Aunt    Social History: Social History   Socioeconomic History  . Marital status: Divorced    Spouse name: Not on file  . Number of children: Not on file  . Years of education: Not on file  . Highest education level: Not on file  Occupational History  . Not on file  Social Needs  . Financial resource strain: Not on file  . Food insecurity:    Worry: Not on file    Inability: Not on file  . Transportation needs:    Medical: Not on file    Non-medical: Not on file  Tobacco Use  . Smoking status: Never Smoker  . Smokeless tobacco: Never Used  Substance and Sexual Activity  . Alcohol use: Yes    Alcohol/week: 1.8 - 2.4 oz    Types: 3 - 4 Standard drinks or equivalent per week    Comment: wine or liquor once a week  . Drug use: No  . Sexual activity: Yes    Birth control/protection: Surgical  Lifestyle  . Physical activity:    Days per week: Not on file    Minutes per session: Not on file  . Stress: Not on file  Relationships  . Social connections:    Talks on phone: Not on file    Gets together: Not on file    Attends religious service: Not on file    Active member of club or organization: Not on file    Attends meetings of clubs or organizations: Not on file    Relationship status: Not on file  . Intimate partner violence:    Fear of current or ex partner: Not on file    Emotionally abused: Not on file    Physically abused: Not on file    Forced sexual activity: Not on file  Other Topics Concern  . Not on file  Social History Narrative  . Not on file   Allergies: Allergies  Allergen Reactions  . Ace Inhibitors Swelling    Angioedema.  . Prednisone Other (See Comments)    Flu like symptoms   Current Medications: Current Outpatient Medications  Medication  Sig Dispense Refill  . ALPRAZolam (XANAX) 0.5 MG tablet Take 1 tablet (0.5 mg total) by mouth 3 (three) times daily as needed. 90 tablet 5  . atorvastatin (LIPITOR) 40 MG tablet TAKE ONE TABLET BY MOUTH AT BEDTIME 90 tablet 1  . buPROPion (WELLBUTRIN SR) 150 MG 12 hr tablet TAKE 1 TABLET BY MOUTH TWICE A DAY 60 tablet 5  . cetirizine (ZYRTEC) 10 MG tablet Take 10 mg by mouth daily as needed.     . fluticasone (FLONASE) 50 MCG/ACT nasal spray Place 2 sprays into both nostrils daily as needed.     . hydrochlorothiazide (HYDRODIURIL) 25 MG tablet Take 1 tablet (25 mg total) by mouth daily. 90 tablet 1  . meloxicam (MOBIC) 15 MG tablet TAKE  1 TABLET BY MOUTH DAILY 90 tablet 1  . metoprolol tartrate (LOPRESSOR) 50 MG tablet TAKE 1 TABLET BY MOUTH TWICE (2) DAILY 180 tablet 1  . traMADol (ULTRAM) 50 MG tablet TAKE 1 TABLET BY MOUTH EVERY 6 HOURS AS NEEDED 60 tablet 3   No current facility-administered medications for this visit.     Review of Systems  *** General: positive for chronic fatigue and malaise. Negative for anorexia, chills, fevers, night sweats, and/or weight loss.   HEENT: negative for changes in vision, pain, or diplopia. Negative for changes in hearing, pain, discharge, tinnitus, vertigo, voice changes, sore throat, neck masses  Lungs: no sob. Negative for dyspnea, orthopnea, or productive cough  Cardiac: 'racing heart' on exertion. Positive for fatigue. Negative for pain, discomfort, or pressure  GI: abdominal bloating, LUQ discomfort, generalized abdominal pain.   GU: no complaints  Musculoskeletal: chronic back pain  Extremities: no complaints  Skin: negative for changes in color, textures, new moles or lesions  Neuro: no complaints  Endocrine: no complaints  Psych: no complaints       Objective:  Physical Examination:  There were no vitals taken for this visit.   ECOG Performance Status: 1 - Symptomatic but completely ambulatory  Exam chaperoned by NP EGBUS/Vagina:  normal. Vaginal cuff intact. No gross lesions. Bimanual: <1cm thickness and possible nodule on anterior vaginal wall on exam which is stable.    Lab Review CA 125 (07/14/17 ordered by PCP) 7. 10/31/2013- 33.6.   Radiologic Imaging: n/a      Assessment:  Jennifer Holt is a 57 y.o. female diagnosed with stage Ib, grade 2 endometrial adenocarcinoma s/p brachytherapy.   Abdominal and back symptoms are nonspecific, CT scan negative for recurrence.   Vaginal cuff thickness may represent scarring and stable on exam.   Plan:   Problem List Items Addressed This Visit    None    Visit Diagnoses    Endometrial cancer ([place])    -  Primary      Follow up 6 months for continued surveillance.  ***Given her complaints of abdominal distention, bloating, and fatigue and negative CT scan we urged her to follow up with her PCP. If uncertain diagnosis can consider GI consult.    Beckey Rutter, DNP AGNP-C  I personally reviewed the patient's history, completed key elements of her exam, and was involved in decision making in conjunction with Ms. Beckey Rutter.   Gillis Ends, MD    CC:  Owens Loffler, MD [address] [place], [place] [address] [phone]

## 2018-03-14 ENCOUNTER — Telehealth: Payer: Self-pay

## 2018-03-14 NOTE — Telephone Encounter (Signed)
Ms. Uliano did not show for her 6/12 Gyn Onc appt. Message sent to scheduling to get her rescheduled.  Oncology Nurse Navigator Documentation  Navigator Location: [facility] (03/14/18 1400)   )Navigator Encounter Type: Other;Follow-up Appt (03/14/18 1400)                                                    Time Spent with Patient: 15 (03/14/18 1400)

## 2018-03-16 ENCOUNTER — Ambulatory Visit: Payer: Medicare Other | Admitting: Family Medicine

## 2018-03-18 ENCOUNTER — Ambulatory Visit: Payer: Medicare Other | Admitting: Family Medicine

## 2018-04-07 ENCOUNTER — Other Ambulatory Visit: Payer: Self-pay | Admitting: *Deleted

## 2018-04-07 MED ORDER — ALPRAZOLAM 0.5 MG PO TABS: 0.5000 mg | ORAL_TABLET | Freq: Three times a day (TID) | ORAL | 0 refills | Status: DC | PRN

## 2018-04-07 NOTE — Telephone Encounter (Signed)
Name of Medication: Alprazolam Name of Pharmacy: [facility] or Written Date and Quantity: 22 with 5 refills Last Office Visit and Type: 09/15/2017 for HTN Next Office Visit and Type: 04/25/18 6 month refill Last Controlled Substance Agreement Date: None on file Last UDS: 06/22/2014 from [facility]

## 2018-04-20 ENCOUNTER — Inpatient Hospital Stay: Payer: Medicare Other | Attending: Obstetrics and Gynecology | Admitting: Obstetrics and Gynecology

## 2018-04-20 VITALS — BP 159/100 | HR 77 | Temp 97.9°F | Resp 18 | Ht 64.75 in | Wt 220.4 lb

## 2018-04-20 DIAGNOSIS — C541 Malignant neoplasm of endometrium: Secondary | ICD-10-CM | POA: Insufficient documentation

## 2018-04-20 NOTE — Progress Notes (Signed)
Patient c/o feeling light headed. Patient c/o fatigue/ feeling tired, weakness, abdominal bloating/ distention, vaginal spotting / blood after sex only.

## 2018-04-20 NOTE — Progress Notes (Signed)
Gynecologic Oncology Interval Visit   Referring Provider: Owens Loffler, MD [address] [place], [place] [address] [phone]   Chief Concern: surveillance endometrial cancer   Subjective:  Jennifer Holt is a 57 y.o. female who is seen in consultation from Dr. Lorelei Pont for surveillance for endometrial cancer. She presents today for continued surveillance. At her last visit she had a stable possible nodule on anterior vaginal wall on exam.  She has noted spotting after intercourse.    Gynecologic Oncology GEORGANN BRAMBLE is a pleasant female who is seen in consultation from Dr. Lorelei Pont for surveillance for endometrial cancer. 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 Aortic L M.D., omental biopsy and washings performed to 11/23/2013. She underwent adjuvant radiotherapy with vaginal brachytherapy for stage Ib, grade 2, positive cytology endometrial cancer. Tumor size was reported as 6 x 3.2 x 1.6 cm. No LVSI and without other evidence of extrauterine disease.  Patient was lost to follow-up due to lack of insurance coverage and returns to clinic today for surveillance. She has established primary care with Dr. Lorelei Pont. Patient has multiple complaints including fatigue epigastric pain and bloating with burping and flatulence, worsened by large meals. Patient believes she had a colonoscopy in 2011 that was normal and does not regularly see GI. She has a history of chronic back pain. Review of MRI from 01/25/2014 shows evidence of degenerative disc disease most significantly at L4-L5. She reports that this pain is unchanged.   CT scan from 08/17/2014 showed 6 mm hypodensity in the right lobe of the liver posteriorly that was consistent with previously exams, stable,benign in appearance. No evidence of disease on CT.  CT scan 07/27/2017 1. No explanation for the patient's abdominal distention and discomfort is seen. The appendix  and terminal ileum appear normal. 2. Moderate cardiomegaly. Moderate abdominal aortic atherosclerosis. 3. Small midline abdominal wall hernia just above the umbilicus containing only fat. 4. Question of mild fatty infiltration of the liver.   Problem List: Patient Active Problem List   Diagnosis Date Noted  . Hypertension   . Hyperlipidemia   . Allergic rhinitis due to pollen   . GERD (gastroesophageal reflux disease)   . Major depressive disorder, recurrent episode with anxious distress ([place])   . Panic attacks   . Osteoarthritis, multiple sites   . FIGO stage II endometrial cancer [facility])     Past Medical History: Past Medical History:  Diagnosis Date  . Allergic rhinitis due to pollen   . Blood transfusion without reported diagnosis   . FIGO stage II endometrial cancer [facility])    [facility]. + malignant cells in peritoneal fluid.   Marland Kitchen GERD (gastroesophageal reflux disease)   . Hyperlipidemia   . Hypertension   . Major depressive disorder, recurrent episode with anxious distress ([place])   . Osteoarthritis, multiple sites   . Panic attacks   . Uterine cancer ([place]) 2015    Past Surgical History: Past Surgical History:  Procedure Laterality Date  . ABDOMINAL HYSTERECTOMY    . TONSILLECTOMY    . TONSILLECTOMY AND ADENOIDECTOMY    . TOTAL ABDOMINAL HYSTERECTOMY W/ BILATERAL SALPINGOOPHORECTOMY     BSO    Past Gynecologic History:  Menarche: No longer menstruates  History of OCP/HRT use: No use of HRT History of Abnormal pap: yes, per gyn hx; pt unsure of findings Last pap: per gyn hx History of STDs: The patient denies history of sexually transmitted disease. Contraception: TAH BSO Sexually  active: yes  OB History:  OB History  No data available  Gravida 2 Para 2  Family History: Family History  Problem Relation Age of Onset  . Arthritis Mother   . Hyperlipidemia Mother   . Stroke Mother   . Hypertension Mother   . Mental illness Mother   . Hyperlipidemia  Father   . Heart disease Father   . Hypertension Father   . Mental illness Brother   . Stroke Maternal Grandmother   . Hypertension Maternal Grandmother   . Mental illness Brother   . Breast cancer Sister   . Breast cancer Paternal Aunt   . Cancer Paternal Aunt    Social History: Social History   Socioeconomic History  . Marital status: Divorced    Spouse name: Not on file  . Number of children: Not on file  . Years of education: Not on file  . Highest education level: Not on file  Occupational History  . Not on file  Social Needs  . Financial resource strain: Not on file  . Food insecurity:    Worry: Not on file    Inability: Not on file  . Transportation needs:    Medical: Not on file    Non-medical: Not on file  Tobacco Use  . Smoking status: Never Smoker  . Smokeless tobacco: Never Used  Substance and Sexual Activity  . Alcohol use: Yes    Alcohol/week: 1.8 - 2.4 oz    Types: 3 - 4 Standard drinks or equivalent per week    Comment: wine or liquor once a week  . Drug use: No  . Sexual activity: Yes    Birth control/protection: Surgical  Lifestyle  . Physical activity:    Days per week: Not on file    Minutes per session: Not on file  . Stress: Not on file  Relationships  . Social connections:    Talks on phone: Not on file    Gets together: Not on file    Attends religious service: Not on file    Active member of club or organization: Not on file    Attends meetings of clubs or organizations: Not on file    Relationship status: Not on file  . Intimate partner violence:    Fear of current or ex partner: Not on file    Emotionally abused: Not on file    Physically abused: Not on file    Forced sexual activity: Not on file  Other Topics Concern  . Not on file  Social History Narrative  . Not on file   Allergies: Allergies  Allergen Reactions  . Ace Inhibitors Swelling    Angioedema.  . Prednisone Other (See Comments)    Flu like symptoms    Current Medications: Current Outpatient Medications  Medication Sig Dispense Refill  . ALPRAZolam (XANAX) 0.5 MG tablet Take 1 tablet (0.5 mg total) by mouth 3 (three) times daily as needed. 90 tablet 0  . atorvastatin (LIPITOR) 40 MG tablet TAKE ONE TABLET BY MOUTH AT BEDTIME 90 tablet 1  . buPROPion (WELLBUTRIN SR) 150 MG 12 hr tablet TAKE 1 TABLET BY MOUTH TWICE A DAY 60 tablet 5  . hydrochlorothiazide (HYDRODIURIL) 25 MG tablet Take 1 tablet (25 mg total) by mouth daily. 90 tablet 1  . meloxicam (MOBIC) 15 MG tablet TAKE 1 TABLET BY MOUTH DAILY 90 tablet 1  . metoprolol tartrate (LOPRESSOR) 50 MG tablet TAKE 1 TABLET BY MOUTH TWICE (2) DAILY 180 tablet 1  .  traMADol (ULTRAM) 50 MG tablet TAKE 1 TABLET BY MOUTH EVERY 6 HOURS AS NEEDED 60 tablet 3  . cetirizine (ZYRTEC) 10 MG tablet Take 10 mg by mouth daily as needed.     . fluticasone (FLONASE) 50 MCG/ACT nasal spray Place 2 sprays into both nostrils daily as needed.      No current facility-administered medications for this visit.     Review of Systems General:  Fatigue, weakness, allergy Skin: no complaints Eyes: no complaints HEENT: no complaints Breasts: no complaints Pulmonary: sob w/ exertion Cardiac: no complaints Gastrointestinal: abdominal bloating, distension Genitourinary/Sexual: no complaints Ob/Gyn: spotting after intercourse Musculoskeletal: back pain Hematology: no complaints Neurologic/Psych: depression; numbness tingling     Objective:  Physical Examination:  BP (!) 159/100 (BP Location: Right Arm, Patient Position: Sitting)   Pulse 77   Temp 97.9 F (36.6 C) (Tympanic)   Resp 18   Ht 5' 4.75" (1.645 m)   Wt 220 lb 6.4 oz (100 kg)   BMI 36.96 kg/m    ECOG Performance Status: 1 - Symptomatic but completely ambulatory  GENERAL: Patient is a well appearing female in no acute distress HEENT:  Sclerae anicteric.  Oropharynx clear and moist. Neck is supple.  NODES:  No cervical, supraclavicular, or  axillary lymphadenopathy palpated.  LUNGS:  Clear to auscultation bilaterally.  No wheezes or rhonchi. HEART:  Regular rate and rhythm. No murmur appreciated. ABDOMEN:  Soft, nontender. Positive, normoactive bowel sounds. No organomegaly palpated. MSK:  No focal spinal tenderness to palpation. Full range of motion bilaterally in the upper extremities. EXTREMITIES:  No peripheral edema.   SKIN:  Clear with no obvious rashes or skin changes. NEURO:  Nonfocal. Well oriented.  Appropriate affect.  Exam chaperoned by CMA EGBUS/Vagina: normal except for diffuse telangectasias not noted on last exam. Vaginal cuff intact. No gross lesions. Bimanual: <1cm thickness and possible nodule on anterior vaginal wall on exam which is stable. Cervix/Uterus surgically absent   Lab Review Labs on site today: CA 125 (07/14/17 ordered by PCP) 7. 10/31/2013- 33.6.   Radiologic Imaging: N/a    Assessment:  PORCHIA SINKLER is a 57 y.o. female diagnosed with stage Ib, grade 2 endometrial adenocarcinoma s/p brachytherapy.   Abdominal and back symptoms are nonspecific, CT scan negative for recurrence from 06/2017. She was recommended to have GI referral.   Vaginal cuff thickness may represent scarring and stable on exam.   Postcoital bleeding possibly secondary to vaginal telangectasias, not severe  Plan:   Problem List Items Addressed This Visit      Other   FIGO stage II endometrial cancer ([place]) - Primary (Chronic)   Relevant Orders   CT Abdomen Pelvis W Contrast   CT Chest W Contrast      Follow up 6 months for continued surveillance.  Given her complaints of abdominal distention, bloating, and fatigue and negative CT scan we urged her to follow up with her PCP. If uncertain diagnosis can consider GI consult.   Discussion regarding candle exercises and instructions given. Per Dr. Christel Mormon there is no therapy for telangiectasias that are not severely symptomatic. If severe symptoms than coagulation can  be performed.   Referral to gynecology to establish care. If she is NED at five years she will be released from Gynecologic Oncology clinic.   Beckey Rutter, DNP AGNP-C  I personally reviewed the patient's history, completed key elements of her exam, and was involved in decision making in conjunction with Ms. Beckey Rutter.   Beverely Risen  Zenia Resides, NP  I personally had a face to face interaction and evaluated the patient jointly with the NP, Ms. Beckey Rutter.  I have reviewed her history and available records and have performed the key portions of the physical exam including abdominal exam, pelvic exam with my findings confirming those documented above by the APP.  I have discussed the case with the APP and the patient.  I agree with the above documentation, assessment and plan which was fully formulated by me.  Counseling was completed by me.   I personally saw the patient and performed a substantive portion of this encounter in conjunction with the listed APP as documented above.  A total of 25 minutes were spent with the patient/family today; >50% was spent in education, counseling and coordination of care for endometrial cancer.  Kitiara Hintze Gaetana Michaelis, MD  CC:  Owens Loffler, MD [address] [place], [place] [address] [phone]

## 2018-04-25 ENCOUNTER — Ambulatory Visit (INDEPENDENT_AMBULATORY_CARE_PROVIDER_SITE_OTHER): Payer: Medicare Other | Admitting: Family Medicine

## 2018-04-25 ENCOUNTER — Encounter: Payer: Self-pay | Admitting: Family Medicine

## 2018-04-25 VITALS — BP 136/80 | HR 67 | Temp 98.4°F | Ht 64.75 in | Wt 219.8 lb

## 2018-04-25 DIAGNOSIS — F410 Panic disorder [episodic paroxysmal anxiety] without agoraphobia: Secondary | ICD-10-CM | POA: Diagnosis not present

## 2018-04-25 DIAGNOSIS — F339 Major depressive disorder, recurrent, unspecified: Secondary | ICD-10-CM | POA: Diagnosis not present

## 2018-04-25 DIAGNOSIS — M4722 Other spondylosis with radiculopathy, cervical region: Secondary | ICD-10-CM

## 2018-04-25 DIAGNOSIS — I10 Essential (primary) hypertension: Secondary | ICD-10-CM

## 2018-04-25 DIAGNOSIS — F5101 Primary insomnia: Secondary | ICD-10-CM | POA: Diagnosis not present

## 2018-04-25 MED ORDER — AMITRIPTYLINE HCL 10 MG PO TABS: ORAL_TABLET | ORAL | 2 refills | Status: DC

## 2018-04-25 MED ORDER — METOPROLOL TARTRATE 50 MG PO TABS: 50.0000 mg | ORAL_TABLET | Freq: Two times a day (BID) | ORAL | 1 refills | Status: DC

## 2018-04-25 MED ORDER — HYDROCHLOROTHIAZIDE 25 MG PO TABS: 25.0000 mg | ORAL_TABLET | Freq: Every day | ORAL | 1 refills | Status: DC

## 2018-04-25 MED ORDER — MELOXICAM 15 MG PO TABS: 15.0000 mg | ORAL_TABLET | Freq: Every day | ORAL | 1 refills | Status: DC

## 2018-04-25 NOTE — Progress Notes (Signed)
Dr. Frederico Hamman T. Emberlyn Burlison, MD, [facility] and Sports Medicine [place], [address] Phone: [phone] Fax: [phone]  04/25/2018  Patient: Jennifer Holt, MRN: [id number], DOB: 13-Jul-1961, 57 y.o.  Primary Physician:  Owens Loffler, MD   Chief Complaint  Patient presents with  . Follow-up    6 months  . Anxiety   Subjective:   Jennifer Holt is a 57 y.o. very pleasant female patient who presents with the following:  Multi-problem follow-up, HTN, HLD, Panic disorder.   Having some neck pain, years ago did have some have some panic attacks and will get some tingling down both arms. Pain is really bad. Xanax does halp it.  She is having this occur when she is in public places such as [employer], and then she has to leave the crowded facility and when she does this feels better.  She often will take a Xanax after she does them this makes her symptoms go away and feel better.  Not sleeping much at night. Not sleeping much during the night. Circle of events will stay in bed all day. Pain is really bad. Few minutes to an hour and hurts really bad.  She is up to about 2 in the morning and is often up earlier in the morning also to.  Globally her anxiety is doing poorly.  She has a number of other complaints today also as well.  Past Medical History, Surgical History, Social History, Family History, Problem List, Medications, and Allergies have been reviewed and updated if relevant.  Patient Active Problem List   Diagnosis Date Noted  . FIGO stage II endometrial cancer ([place])     Priority: High  . Hypertension   . Hyperlipidemia   . Allergic rhinitis due to pollen   . GERD (gastroesophageal reflux disease)   . Major depressive disorder, recurrent episode with anxious distress ([place])   . Panic attacks   . Osteoarthritis, multiple sites     Past Medical History:  Diagnosis Date  . Allergic rhinitis due to pollen   . Blood transfusion  without reported diagnosis   . FIGO stage II endometrial cancer [facility])    [facility]. + malignant cells in peritoneal fluid.   Marland Kitchen GERD (gastroesophageal reflux disease)   . Hyperlipidemia   . Hypertension   . Major depressive disorder, recurrent episode with anxious distress ([place])   . Osteoarthritis, multiple sites   . Panic attacks   . Uterine cancer ([place]) 2015    Past Surgical History:  Procedure Laterality Date  . ABDOMINAL HYSTERECTOMY    . TONSILLECTOMY    . TONSILLECTOMY AND ADENOIDECTOMY    . TOTAL ABDOMINAL HYSTERECTOMY W/ BILATERAL SALPINGOOPHORECTOMY     BSO    Social History   Socioeconomic History  . Marital status: Divorced    Spouse name: Not on file  . Number of children: Not on file  . Years of education: Not on file  . Highest education level: Not on file  Occupational History  . Not on file  Social Needs  . Financial resource strain: Not on file  . Food insecurity:    Worry: Not on file    Inability: Not on file  . Transportation needs:    Medical: Not on file    Non-medical: Not on file  Tobacco Use  . Smoking status: Never Smoker  . Smokeless tobacco: Never Used  Substance and Sexual Activity  . Alcohol use: Yes  Alcohol/week: 1.8 - 2.4 oz    Types: 3 - 4 Standard drinks or equivalent per week    Comment: wine or liquor once a week  . Drug use: No  . Sexual activity: Yes    Birth control/protection: Surgical  Lifestyle  . Physical activity:    Days per week: Not on file    Minutes per session: Not on file  . Stress: Not on file  Relationships  . Social connections:    Talks on phone: Not on file    Gets together: Not on file    Attends religious service: Not on file    Active member of club or organization: Not on file    Attends meetings of clubs or organizations: Not on file    Relationship status: Not on file  . Intimate partner violence:    Fear of current or ex partner: Not on file    Emotionally abused: Not on file     Physically abused: Not on file    Forced sexual activity: Not on file  Other Topics Concern  . Not on file  Social History Narrative  . Not on file    Family History  Problem Relation Age of Onset  . Arthritis Mother   . Hyperlipidemia Mother   . Stroke Mother   . Hypertension Mother   . Mental illness Mother   . Hyperlipidemia Father   . Heart disease Father   . Hypertension Father   . Mental illness Brother   . Stroke Maternal Grandmother   . Hypertension Maternal Grandmother   . Mental illness Brother   . Breast cancer Sister   . Breast cancer Paternal Aunt   . Cancer Paternal Aunt     Allergies  Allergen Reactions  . Ace Inhibitors Swelling    Angioedema.  . Prednisone Other (See Comments)    Flu like symptoms    Medication list reviewed and updated in full in [place].   GEN: No acute illnesses, no fevers, chills. GI: No n/v/d, eating normally Pulm: No SOB Interactive and getting along well at home.  Otherwise, ROS is as per the HPI.  Objective:   BP 136/80   Pulse 67   Temp 98.4 F (36.9 C) (Oral)   Ht 5' 4.75" (1.645 m)   Wt 219 lb 12 oz (99.7 kg)   BMI 36.85 kg/m   GEN: WDWN, NAD, Non-toxic, A & O x 3 HEENT: Atraumatic, Normocephalic. Neck supple. No masses, No LAD. Ears and Nose: No external deformity. CV: RRR, No M/G/R. No JVD. No thrill. No extra heart sounds. PULM: CTA B, no wheezes, crackles, rhonchi. No retractions. No resp. distress. No accessory muscle use. EXTR: No c/c/e NEURO Normal gait.  PSYCH: Normally interactive. Conversant. Anxious appearing.   Laboratory and Imaging Data:  Assessment and Plan:   Panic attacks  Essential hypertension - Plan: metoprolol tartrate (LOPRESSOR) 50 MG tablet  Major depressive disorder, recurrent episode with anxious distress (HCC)  Cervical radiculopathy due to osteoarthritis of spine  Primary insomnia   Anxiety, as well as panic attacks and insomnia are all doing quite poorly.   She did not tolerate Prozac before.  First and foremost I think we need to get the patient some sleep, and ominous start her on some amitriptyline at 10 mg at night and then titrate up the dose to 20 mg after a week.  She is also having some neck pain and some radicular type symptoms, which seem to be associated  with panic attack.  Not clear if this is purely a discogenic versus panic induced, because it seems as if it goes away when her panic attack resolves.  For now, we are going to treat her anxiety and panic disorder and see how she is doing at follow-up.  Follow-up: Return in about 6 weeks (around 06/06/2018).  Meds ordered this encounter  Medications  . amitriptyline (ELAVIL) 10 MG tablet    Sig: Take 1-2 tablets 30 minutes before bed    Dispense:  60 tablet    Refill:  2  . hydrochlorothiazide (HYDRODIURIL) 25 MG tablet    Sig: Take 1 tablet (25 mg total) by mouth daily.    Dispense:  90 tablet    Refill:  1  . meloxicam (MOBIC) 15 MG tablet    Sig: Take 1 tablet (15 mg total) by mouth daily.    Dispense:  90 tablet    Refill:  1  . metoprolol tartrate (LOPRESSOR) 50 MG tablet    Sig: Take 1 tablet (50 mg total) by mouth 2 (two) times daily.    Dispense:  180 tablet    Refill:  1   Signed,  Takerra Lupinacci T. Siriah Treat, MD   Allergies as of 04/25/2018      Reactions   Ace Inhibitors Swelling   Angioedema.   Prednisone Other (See Comments)   Flu like symptoms      Medication List        Accurate as of 04/25/18  2:04 PM. Always use your most recent med list.          ALPRAZolam 0.5 MG tablet Commonly known as:  XANAX Take 1 tablet (0.5 mg total) by mouth 3 (three) times daily as needed.   amitriptyline 10 MG tablet Commonly known as:  ELAVIL Take 1-2 tablets 30 minutes before bed   atorvastatin 40 MG tablet Commonly known as:  LIPITOR TAKE ONE TABLET BY MOUTH AT BEDTIME   buPROPion 150 MG 12 hr tablet Commonly known as:  WELLBUTRIN SR TAKE 1 TABLET BY MOUTH TWICE A  DAY   cetirizine 10 MG tablet Commonly known as:  ZYRTEC Take 10 mg by mouth daily as needed.   fluticasone 50 MCG/ACT nasal spray Commonly known as:  FLONASE Place 2 sprays into both nostrils daily as needed.   hydrochlorothiazide 25 MG tablet Commonly known as:  HYDRODIURIL Take 1 tablet (25 mg total) by mouth daily.   meloxicam 15 MG tablet Commonly known as:  MOBIC Take 1 tablet (15 mg total) by mouth daily.   metoprolol tartrate 50 MG tablet Commonly known as:  LOPRESSOR Take 1 tablet (50 mg total) by mouth 2 (two) times daily.   traMADol 50 MG tablet Commonly known as:  ULTRAM TAKE 1 TABLET BY MOUTH EVERY 6 HOURS AS NEEDED

## 2018-04-25 NOTE — Patient Instructions (Addendum)
Take the Amitryptiline 10 mg tablets - take it 30 minutes before bed.   After 1 week, increase to 2 tablets before bed. If this makes you too drowsy in the morning, then cut back to 1 tablet.

## 2018-04-27 ENCOUNTER — Telehealth: Payer: Self-pay

## 2018-04-27 NOTE — Telephone Encounter (Signed)
spoke with [facility] office to see if they received the referral from 04/20/18. They have been unsuccessful in reaching the patient to get her schedule for a new patient appointment. The office staff report they will keep trying to reach the patient

## 2018-04-28 ENCOUNTER — Ambulatory Visit
Admission: RE | Admit: 2018-04-28 | Discharge: 2018-04-28 | Disposition: A | Discharge status: Home / Self Care | Payer: Medicare Other | Source: Ambulatory Visit | Attending: Nurse Practitioner | Admitting: Nurse Practitioner

## 2018-04-28 DIAGNOSIS — K439 Ventral hernia without obstruction or gangrene: Secondary | ICD-10-CM | POA: Diagnosis not present

## 2018-04-28 DIAGNOSIS — C541 Malignant neoplasm of endometrium: Secondary | ICD-10-CM | POA: Insufficient documentation

## 2018-04-28 DIAGNOSIS — J984 Other disorders of lung: Secondary | ICD-10-CM | POA: Diagnosis not present

## 2018-04-28 MED ORDER — IOHEXOL 300 MG/ML  SOLN
100.0000 mL | Freq: Once | INTRAMUSCULAR | Status: AC | PRN
  Administered 2018-04-28: 100 mL via INTRAVENOUS

## 2018-04-29 ENCOUNTER — Telehealth: Payer: Self-pay | Admitting: Nurse Practitioner

## 2018-04-29 NOTE — Telephone Encounter (Signed)
Called patient with results of recent CT. CT did not show cause of her GI symptoms. Per Dr. Theora Gianotti, patient recommended to f/u with GI and PCP. She was also referred to [facility] to establish care. She missed their call but will call back to set up care. Patient thanked for call.

## 2018-05-06 ENCOUNTER — Other Ambulatory Visit: Payer: Self-pay | Admitting: *Deleted

## 2018-05-06 NOTE — Telephone Encounter (Signed)
Last office visit 04/25/2018.  Last refilled Tramadol 11/29/2017 for #60 with 3 refills.  Alprazolam 04/07/2018 for #90 with no refills.  Ok to refill?

## 2018-05-08 MED ORDER — TRAMADOL HCL 50 MG PO TABS: 50.0000 mg | ORAL_TABLET | Freq: Four times a day (QID) | ORAL | 3 refills | Status: DC | PRN

## 2018-05-08 MED ORDER — ALPRAZOLAM 0.5 MG PO TABS: 0.5000 mg | ORAL_TABLET | Freq: Three times a day (TID) | ORAL | 3 refills | Status: DC | PRN

## 2018-06-02 ENCOUNTER — Other Ambulatory Visit: Payer: Self-pay | Admitting: Family Medicine

## 2018-06-02 NOTE — Telephone Encounter (Signed)
I spoke with Mr Christian;he prefers to speak to [facility]. Butch Penny was with pt; he said he is calling for refills for Atira Borello to [place] for atorvastatin. I advised Mr Darrick Meigs [facility] signed) that Butch Penny had given 90 day refill today for atorvastatin to [place] but pt would need to come in for appt with labs prior to appt with Dr Lorelei Pont to see lab values for cholesterol. Mr Darrick Meigs said his brother has died and he just wants to make sure Mrs Akey has med for now. Mr Darrick Meigs will cb to schedule appt after gets back home from brothers funeral. Liana Gerold condolences for loss of his brother and advised OK to cb to schedule appt for f/u and labs for cholesterol; can schedule for AWV /CPX also. Mr Darrick Meigs voiced understanding and appreciative.

## 2018-06-06 ENCOUNTER — Ambulatory Visit: Payer: Medicare Other | Admitting: Family Medicine

## 2018-06-16 DIAGNOSIS — N930 Postcoital and contact bleeding: Secondary | ICD-10-CM | POA: Diagnosis not present

## 2018-06-16 DIAGNOSIS — C541 Malignant neoplasm of endometrium: Secondary | ICD-10-CM | POA: Diagnosis not present

## 2018-06-17 ENCOUNTER — Other Ambulatory Visit: Payer: Self-pay | Admitting: Obstetrics & Gynecology

## 2018-06-17 DIAGNOSIS — Z1231 Encounter for screening mammogram for malignant neoplasm of breast: Secondary | ICD-10-CM

## 2018-07-07 ENCOUNTER — Ambulatory Visit
Admission: RE | Admit: 2018-07-07 | Discharge: 2018-07-07 | Disposition: A | Discharge status: Home / Self Care | Payer: Medicare Other | Source: Ambulatory Visit | Attending: Obstetrics & Gynecology | Admitting: Obstetrics & Gynecology

## 2018-07-07 DIAGNOSIS — Z1231 Encounter for screening mammogram for malignant neoplasm of breast: Secondary | ICD-10-CM | POA: Insufficient documentation

## 2018-07-07 HISTORY — DX: Personal history of irradiation: Z92.3

## 2018-07-14 ENCOUNTER — Other Ambulatory Visit: Payer: Self-pay | Admitting: Obstetrics & Gynecology

## 2018-07-14 DIAGNOSIS — R928 Other abnormal and inconclusive findings on diagnostic imaging of breast: Secondary | ICD-10-CM

## 2018-07-14 DIAGNOSIS — N6489 Other specified disorders of breast: Secondary | ICD-10-CM | POA: Diagnosis not present

## 2018-07-21 ENCOUNTER — Ambulatory Visit
Admission: RE | Admit: 2018-07-21 | Discharge: 2018-07-21 | Disposition: A | Discharge status: Home / Self Care | Payer: Medicare Other | Source: Ambulatory Visit | Attending: Obstetrics & Gynecology | Admitting: Obstetrics & Gynecology

## 2018-07-21 DIAGNOSIS — R928 Other abnormal and inconclusive findings on diagnostic imaging of breast: Secondary | ICD-10-CM

## 2018-07-21 DIAGNOSIS — R921 Mammographic calcification found on diagnostic imaging of breast: Secondary | ICD-10-CM | POA: Diagnosis not present

## 2018-07-25 ENCOUNTER — Ambulatory Visit: Payer: Medicare Other

## 2018-08-02 ENCOUNTER — Other Ambulatory Visit: Payer: Self-pay | Admitting: Family Medicine

## 2018-08-02 NOTE — Telephone Encounter (Signed)
Last office visit 04/25/2018 for Panic Attacks.  Last refilled 04/15/2018 for #60 with 2 refills. Next appt: 10/31/2018 for CPE.

## 2018-08-15 ENCOUNTER — Encounter: Payer: Medicare Other | Admitting: Family Medicine

## 2018-08-31 ENCOUNTER — Other Ambulatory Visit: Payer: Self-pay | Admitting: *Deleted

## 2018-08-31 DIAGNOSIS — F410 Panic disorder [episodic paroxysmal anxiety] without agoraphobia: Secondary | ICD-10-CM

## 2018-08-31 DIAGNOSIS — F339 Major depressive disorder, recurrent, unspecified: Secondary | ICD-10-CM

## 2018-08-31 MED ORDER — ATORVASTATIN CALCIUM 40 MG PO TABS: 40.0000 mg | ORAL_TABLET | Freq: Every day | ORAL | 0 refills | Status: DC

## 2018-08-31 MED ORDER — BUPROPION HCL ER (SR) 150 MG PO TB12: 150.0000 mg | ORAL_TABLET | Freq: Two times a day (BID) | ORAL | 0 refills | Status: DC

## 2018-10-03 ENCOUNTER — Other Ambulatory Visit: Payer: Self-pay | Admitting: Family Medicine

## 2018-10-03 DIAGNOSIS — F410 Panic disorder [episodic paroxysmal anxiety] without agoraphobia: Secondary | ICD-10-CM

## 2018-10-03 DIAGNOSIS — F339 Major depressive disorder, recurrent, unspecified: Secondary | ICD-10-CM

## 2018-10-03 NOTE — Telephone Encounter (Signed)
Last office visit 04/25/2018 for Panic Attacks.  Last refilled Alprazolam 05/08/2018 for #90 with 3 refills.  Tramadol 05/08/2018 for #60 with 3 refills.  Next Appt: 10/31/2018 for CPE.

## 2018-10-19 ENCOUNTER — Inpatient Hospital Stay: Payer: Medicare Other | Attending: Obstetrics and Gynecology | Admitting: Obstetrics and Gynecology

## 2018-10-19 ENCOUNTER — Encounter: Payer: Self-pay | Admitting: Obstetrics and Gynecology

## 2018-10-19 VITALS — BP 155/95 | HR 70 | Temp 97.9°F | Resp 18 | Ht 64.75 in | Wt 218.0 lb

## 2018-10-19 DIAGNOSIS — C541 Malignant neoplasm of endometrium: Secondary | ICD-10-CM

## 2018-10-19 DIAGNOSIS — Z90722 Acquired absence of ovaries, bilateral: Secondary | ICD-10-CM

## 2018-10-19 DIAGNOSIS — Z90710 Acquired absence of both cervix and uterus: Secondary | ICD-10-CM | POA: Diagnosis not present

## 2018-10-19 DIAGNOSIS — Z923 Personal history of irradiation: Secondary | ICD-10-CM

## 2018-10-19 NOTE — Progress Notes (Signed)
Gynecologic Oncology Interval Visit   Referring Provider: Owens Loffler, MD [address] [place], [place] [address] [phone]  Chief Concern: surveillance endometrial cancer   Subjective:  Jennifer Holt is a 58 y.o. female, initially seen in consultation from Dr. Lorelei Pont for surveillance for endometrial cancer. She presents today for continued surveillance, consideration of release from clinic.    She was last seen by Dr. Theora Gianotti on 04/20/18.  That time, she reported spotting after intercourse which was thought to be secondary to vaginal telangectasias.  Vaginal cuff thickness noted on exam and thought to represent scarring and overall stable compared to previous exams.   04/28/2018-CT C/A/P W Contrast for GI complaints  -No acute process within the chest, abdomen, or pelvis.  No evidence of metastatic disease in the chest, abdomen, or pelvis.  It was recommended that she follow-up with GI and PCP which she has not done.   She was referred to [place] to establish care for continued surveillance.  She was seen by Dr. Vikki Ports Ward on 06/16/2018. Screening Mammogram on 07/07/18 was reported as abnormal for right breast. This was followed up with a diagnostic mammogram on 07/21/18 which was reported as negative.   Complains of some hot flashes.   Gynecologic Oncology [place] Jennifer Holt is a pleasant female who is seen in consultation from Dr. Lorelei Pont for surveillance for endometrial cancer. 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 Aortic L M.D., omental biopsy and washings performed 11/23/2013 by Dr. Sabra Heck. She underwent adjuvant radiotherapy with vaginal brachytherapy for stage Ib, grade 2, positive cytology endometrial cancer. Tumor size was reported as 6 x 3.2 x 1.6 cm. No LVSI and without other evidence of extrauterine disease.  Patient was lost to follow-up due to lack of insurance coverage and returns to clinic today for  surveillance. She has established primary care with Dr. Lorelei Pont. Patient has multiple complaints including fatigue epigastric pain and bloating with burping and flatulence, worsened by large meals. Patient believes she had a colonoscopy in 2011 that was normal and does not regularly see GI. She has a history of chronic back pain. Review of MRI from 01/25/2014 shows evidence of degenerative disc disease most significantly at L4-L5. She reports that this pain is unchanged.   CT Scan from 08/17/2014: showed 6 mm hypodensity in the right lobe of the liver posteriorly that was consistent with previously exams, stable,benign in appearance. No evidence of disease on CT.  CT Scan 07/27/2017 1. No explanation for the patient's abdominal distention and discomfort is seen. The appendix and terminal ileum appear normal. 2. Moderate cardiomegaly. Moderate abdominal aortic atherosclerosis. 3. Small midline abdominal wall hernia just above the umbilicus containing only fat. 4. Question of mild fatty infiltration of the liver.  Problem List: Patient Active Problem List   Diagnosis Date Noted  . Hypertension   . Hyperlipidemia   . Allergic rhinitis due to pollen   . GERD (gastroesophageal reflux disease)   . Major depressive disorder, recurrent episode with anxious distress ([place])   . Panic attacks   . Osteoarthritis, multiple sites   . FIGO stage II endometrial cancer [facility])    Past Medical History: Past Medical History:  Diagnosis Date  . Allergic rhinitis due to pollen   . Blood transfusion without reported diagnosis   . FIGO stage II endometrial cancer [facility])    [facility]. + malignant cells in peritoneal fluid.   Marland Kitchen GERD (gastroesophageal reflux disease)   .  Hyperlipidemia   . Hypertension   . Major depressive disorder, recurrent episode with anxious distress ([place])   . Osteoarthritis, multiple sites   . Panic attacks   . Personal history of radiation therapy 2015   uterine ca  . Uterine cancer  ([place]) 2014   Past Surgical History: Past Surgical History:  Procedure Laterality Date  . ABDOMINAL HYSTERECTOMY    . TONSILLECTOMY    . TONSILLECTOMY AND ADENOIDECTOMY    . TOTAL ABDOMINAL HYSTERECTOMY W/ BILATERAL SALPINGOOPHORECTOMY     BSO   Past Gynecologic History:  Menarche: No longer menstruates  History of OCP/HRT use: No use of HRT History of Abnormal pap: yes, per gyn hx; pt unsure of findings Last pap: per gyn hx History of STDs: The patient denies history of sexually transmitted disease. Contraception: TAH BSO Sexually active: yes  OB History:  Gravida 2 Para 2  Family History: Family History  Problem Relation Age of Onset  . Arthritis Mother   . Hyperlipidemia Mother   . Stroke Mother   . Hypertension Mother   . Mental illness Mother   . Hyperlipidemia Father   . Heart disease Father   . Hypertension Father   . Mental illness Brother   . Stroke Maternal Grandmother   . Hypertension Maternal Grandmother   . Mental illness Brother   . Breast cancer Sister   . Breast cancer Paternal Aunt   . Cancer Paternal Aunt    Social History: Social History   Socioeconomic History  . Marital status: Divorced    Spouse name: Not on file  . Number of children: Not on file  . Years of education: Not on file  . Highest education level: Not on file  Occupational History  . Not on file  Social Needs  . Financial resource strain: Not on file  . Food insecurity:    Worry: Not on file    Inability: Not on file  . Transportation needs:    Medical: Not on file    Non-medical: Not on file  Tobacco Use  . Smoking status: Never Smoker  . Smokeless tobacco: Never Used  Substance and Sexual Activity  . Alcohol use: Yes    Alcohol/week: 3.0 - 4.0 standard drinks    Types: 3 - 4 Standard drinks or equivalent per week    Comment: wine or liquor once a week  . Drug use: No  . Sexual activity: Yes    Birth control/protection: Surgical  Lifestyle  . Physical  activity:    Days per week: Not on file    Minutes per session: Not on file  . Stress: Not on file  Relationships  . Social connections:    Talks on phone: Not on file    Gets together: Not on file    Attends religious service: Not on file    Active member of club or organization: Not on file    Attends meetings of clubs or organizations: Not on file    Relationship status: Not on file  . Intimate partner violence:    Fear of current or ex partner: Not on file    Emotionally abused: Not on file    Physically abused: Not on file    Forced sexual activity: Not on file  Other Topics Concern  . Not on file  Social History Narrative  . Not on file   Allergies: Allergies  Allergen Reactions  . Ace Inhibitors Swelling    Angioedema.  . Prednisone Other (See Comments)  Flu like symptoms   Current Medications: Current Outpatient Medications  Medication Sig Dispense Refill  . ALPRAZolam (XANAX) 0.5 MG tablet TAKE 1 TABLET BY MOUTH 3 TIMES A DAY AS NEEDED 90 tablet 3  . amitriptyline (ELAVIL) 10 MG tablet TAKE 1 TO 2 TABLETS BY MOUTH 30 MINUTES BEFORE BED 60 tablet 2  . atorvastatin (LIPITOR) 40 MG tablet Take 1 tablet (40 mg total) by mouth at bedtime. 90 tablet 0  . buPROPion (WELLBUTRIN SR) 150 MG 12 hr tablet TAKE 1 TABLET BY MOUTH TWICE A DAY 60 tablet 0  . cetirizine (ZYRTEC) 10 MG tablet Take 10 mg by mouth daily as needed.     . fluticasone (FLONASE) 50 MCG/ACT nasal spray Place 2 sprays into both nostrils daily as needed.     . hydrochlorothiazide (HYDRODIURIL) 25 MG tablet Take 1 tablet (25 mg total) by mouth daily. 90 tablet 1  . meloxicam (MOBIC) 15 MG tablet Take 1 tablet (15 mg total) by mouth daily. 90 tablet 1  . metoprolol tartrate (LOPRESSOR) 50 MG tablet Take 1 tablet (50 mg total) by mouth 2 (two) times daily. 180 tablet 1  . traMADol (ULTRAM) 50 MG tablet TAKE 1 TABLET BY MOUTH EVERY 6 HOURS AS NEEDED 60 tablet 3   No current facility-administered medications  for this visit.    Review of Systems General:  no complaints Skin: no complaints Eyes: no complaints HEENT: no complaints Breasts: no complaints Pulmonary: no complaints Cardiac: no complaints Gastrointestinal: no complaints Genitourinary/Sexual: no complaints Ob/Gyn: no complaints Musculoskeletal: no complaints Hematology: no complaints Neurologic/Psych: no complaints  Objective:  Physical Examination:  BP (!) 155/95   Pulse 70   Temp 97.9 F (36.6 C) (Tympanic)   Resp 18   Ht 5' 4.75" (1.645 m)   Wt 218 lb (98.9 kg)   BMI 36.56 kg/m   ECOG Performance Status: 1 - Symptomatic but completely ambulatory  GENERAL: Patient is a well appearing female in no acute distress HEENT:  Sclerae anicteric. Oropharynx clear and moist. Neck is supple.  NODES:  No cervical, supraclavicular, axillary, or inguinal lymphadenopathy palpated.  LUNGS:  Clear to auscultation bilaterally.  No wheezes or rhonchi. HEART:  Regular rate and rhythm. No murmur appreciated. ABDOMEN:  Soft, nontender.  Positive, normoactive bowel sounds. No organomegaly palpated. MSK:  No focal spinal tenderness to palpation. Full range of motion bilaterally in the upper extremities. EXTREMITIES:  No peripheral edema.   SKIN:  Clear with no obvious rashes or skin changes. No nail dyscrasia. NEURO:  Nonfocal. Well oriented.  Appropriate affect.  Exam chaperoned by CMA EGBUS/Vagina: normal except for diffuse telangectasias. Vaginal cuff intact. No gross lesions. Bimanual: <1cm thickness and possible nodule on anterior vaginal wall on exam which is stable. Cervix/Uterus surgically absent    Assessment:  Jennifer Holt is a 58 y.o. female diagnosed with stage Ib, grade 2 endometrial adenocarcinoma s/p TAH BSO, pelvic. aortic LND 11/23/2013.  She received post op vaginal brachytherapy.  Abdominal and back symptoms are nonspecific, CT scan negative for recurrence from 06/2017.   Vaginal cuff thickness represents  scarring and stable on exam.  And occasional postcoital bleeding possibly secondary to vaginal telangectasias, and none in past 6 months.  Plan:   Problem List Items Addressed This Visit      Other   FIGO stage II endometrial cancer ([place]) - Primary (Chronic)     Follow up 6 months for continued surveillance and annual exams with Dr Leonides Schanz in [place]. Since she  is NED at five years she will be released from Gynecologic Oncology clinic.   Non hormonal options for hot flashes could be managed by primary care.  Do not want to start effexor, as she is taking Wellbutrin already.  Beckey Rutter, DNP, AGNP-C [place] [facility] [phone] (work cell) [phone] (office)  I personally had a face to face interaction and evaluated the patient jointly with the NP, Ms. Beckey Rutter.  I have reviewed her history and available records and have performed the key portions of the physical exam including abdominal exam, pelvic exam with my findings confirming those documented above by the APP.  I have discussed the case with the APP and the patient.  I agree with the above documentation, assessment and plan which was fully formulated by me.  Counseling was completed by me.  Mellody Drown, MD  CC:  Owens Loffler, MD [address] [place], [place] [address] [phone]

## 2018-10-19 NOTE — Patient Instructions (Signed)
Please call Dr. Guido Sander office to schedule an appointment around July 2020 for annual exam. You can discuss non-hormonal options for management of hot flashes with Dr. Lorelei Pont. We will not schedule you for a follow-up appointment at the [facility] but please continue to have annual exams with Dr. Leonides Schanz for surveillance of endometrial cancer. If you develop concerning symptoms or have concerns/questions, please call the [facility] to make an appointment. Thank you for allowing [place] to participate in your care. Dr. Fransisca Connors and Beckey Rutter, NP

## 2018-10-19 NOTE — Progress Notes (Signed)
No gyn concerns 

## 2018-10-20 ENCOUNTER — Other Ambulatory Visit: Payer: Self-pay | Admitting: Family Medicine

## 2018-10-20 DIAGNOSIS — I10 Essential (primary) hypertension: Secondary | ICD-10-CM

## 2018-10-26 ENCOUNTER — Ambulatory Visit (INDEPENDENT_AMBULATORY_CARE_PROVIDER_SITE_OTHER): Payer: Medicare Other

## 2018-10-26 VITALS — BP 144/100 | HR 57 | Temp 97.7°F | Ht 65.5 in | Wt 218.8 lb

## 2018-10-26 DIAGNOSIS — C541 Malignant neoplasm of endometrium: Secondary | ICD-10-CM

## 2018-10-26 DIAGNOSIS — Z1159 Encounter for screening for other viral diseases: Secondary | ICD-10-CM | POA: Diagnosis not present

## 2018-10-26 DIAGNOSIS — M810 Age-related osteoporosis without current pathological fracture: Secondary | ICD-10-CM

## 2018-10-26 DIAGNOSIS — Z0000 Encounter for general adult medical examination without abnormal findings: Secondary | ICD-10-CM | POA: Diagnosis not present

## 2018-10-26 DIAGNOSIS — I10 Essential (primary) hypertension: Secondary | ICD-10-CM

## 2018-10-26 DIAGNOSIS — E785 Hyperlipidemia, unspecified: Secondary | ICD-10-CM | POA: Diagnosis not present

## 2018-10-26 DIAGNOSIS — R739 Hyperglycemia, unspecified: Secondary | ICD-10-CM | POA: Diagnosis not present

## 2018-10-26 DIAGNOSIS — Z9189 Other specified personal risk factors, not elsewhere classified: Secondary | ICD-10-CM

## 2018-10-26 DIAGNOSIS — Z114 Encounter for screening for human immunodeficiency virus [HIV]: Secondary | ICD-10-CM | POA: Diagnosis not present

## 2018-10-26 LAB — HEPATIC FUNCTION PANEL
ALBUMIN: 4.4 g/dL (ref 3.5–5.2)
ALT: 24 U/L (ref 0–35)
AST: 20 U/L (ref 0–37)
Alkaline Phosphatase: 88 U/L (ref 39–117)
BILIRUBIN DIRECT: 0.1 mg/dL (ref 0.0–0.3)
TOTAL PROTEIN: 7.2 g/dL (ref 6.0–8.3)
Total Bilirubin: 0.7 mg/dL (ref 0.2–1.2)

## 2018-10-26 LAB — CBC WITH DIFFERENTIAL/PLATELET
BASOS ABS: 0.1 10*3/uL (ref 0.0–0.1)
Basophils Relative: 0.9 % (ref 0.0–3.0)
EOS PCT: 1.8 % (ref 0.0–5.0)
Eosinophils Absolute: 0.1 10*3/uL (ref 0.0–0.7)
HCT: 36.7 % (ref 36.0–46.0)
Hemoglobin: 12.5 g/dL (ref 12.0–15.0)
LYMPHS ABS: 1.5 10*3/uL (ref 0.7–4.0)
Lymphocytes Relative: 25.8 % (ref 12.0–46.0)
MCHC: 34.1 g/dL (ref 30.0–36.0)
MCV: 93.8 fl (ref 78.0–100.0)
MONO ABS: 0.6 10*3/uL (ref 0.1–1.0)
Monocytes Relative: 9.4 % (ref 3.0–12.0)
NEUTROS ABS: 3.7 10*3/uL (ref 1.4–7.7)
NEUTROS PCT: 62.1 % (ref 43.0–77.0)
PLATELETS: 255 10*3/uL (ref 150.0–400.0)
RBC: 3.91 Mil/uL (ref 3.87–5.11)
RDW: 12.6 % (ref 11.5–15.5)
WBC: 5.9 10*3/uL (ref 4.0–10.5)

## 2018-10-26 LAB — LIPID PANEL
Cholesterol: 167 mg/dL (ref 0–200)
HDL: 43.2 mg/dL (ref >39.00)
NonHDL: 124
TRIGLYCERIDES: 258 mg/dL — AB (ref 0.0–149.0)
Total CHOL/HDL Ratio: 4
VLDL: 51.6 mg/dL — ABNORMAL HIGH (ref 0.0–40.0)

## 2018-10-26 LAB — HEMOGLOBIN A1C: Hgb A1c MFr Bld: 5.8 % (ref 4.6–6.5)

## 2018-10-26 LAB — TSH: TSH: 3.39 u[IU]/mL (ref 0.35–4.50)

## 2018-10-26 LAB — BASIC METABOLIC PANEL
BUN: 17 mg/dL (ref 6–23)
CALCIUM: 9.4 mg/dL (ref 8.4–10.5)
CO2: 29 meq/L (ref 19–32)
Chloride: 100 mEq/L (ref 96–112)
Creatinine, Ser: 0.89 mg/dL (ref 0.40–1.20)
GFR: 64.76 mL/min (ref >60.00)
Glucose, Bld: 115 mg/dL — ABNORMAL HIGH (ref 70–99)
Potassium: 4.2 mEq/L (ref 3.5–5.1)
SODIUM: 138 meq/L (ref 135–145)

## 2018-10-26 LAB — VITAMIN D 25 HYDROXY (VIT D DEFICIENCY, FRACTURES): VITD: 57.77 ng/mL (ref 30.00–100.00)

## 2018-10-26 LAB — LDL CHOLESTEROL, DIRECT: LDL DIRECT: 86 mg/dL

## 2018-10-26 NOTE — Progress Notes (Signed)
I reviewed health advisor's note, was available for consultation, and agree with documentation and plan.   Signed,  Chibuike Fleek T. Fenris Cauble, MD  

## 2018-10-26 NOTE — Progress Notes (Signed)
PCP notes:   Health maintenance:  Flu vaccine - pt declined Colonoscopy - PCP follow-up requested Tetanus vaccine - postponed Hep C screening - completed HIV screening - completed  Abnormal screenings:   Depression score: 14 Depression screen [facility] 2/9 10/26/2018 10/26/2018 04/25/2018 12/21/2016  Decreased Interest - 2 3 3   Down, Depressed, Hopeless - 2 3 3   PHQ - 2 Score - 4 6 6   Altered sleeping - 2 3 3   Tired, decreased energy - 3 3 3   Change in appetite - 1 2 2   Feeling bad or failure about yourself  - 0 2 3  Trouble concentrating - 2 0 3  Moving slowly or fidgety/restless - 2 0 3  Suicidal thoughts - 0 0 0  PHQ-9 Score - 14 16 23   Difficult doing work/chores - Somewhat difficult Very difficult -   Patient concerns:   None  Nurse concerns:  None  Next PCP appt:   10/31/18 @ 0488

## 2018-10-26 NOTE — Patient Instructions (Signed)
Ms. Symmonds , Thank you for taking time to come for your Medicare Wellness Visit. I appreciate your ongoing commitment to your health goals. Please review the following plan we discussed and let me know if I can assist you in the future.   These are the goals we discussed: Goals    . Patient Stated     Starting 10/26/2018, I will continue to take medications as prescribed.        This is a list of the screening recommended for you and due dates:  Health Maintenance  Topic Date Due  . Flu Shot  10/31/2018*  . Colon Cancer Screening  09/28/2019*  . Tetanus Vaccine  09/28/2019*  . Mammogram  07/07/2020  .  Hepatitis C: One time screening is recommended by Center for Disease Control  (CDC) for  adults born from [birth year] through 1967.   Completed  . HIV Screening  Completed  . Pap Smear  Discontinued  *Topic was postponed. The date shown is not the original due date.   Preventive Care for Adults  A healthy lifestyle and preventive care can promote health and wellness. Preventive health guidelines for adults include the following key practices.  . A routine yearly physical is a good way to check with your health care provider about your health and preventive screening. It is a chance to share any concerns and updates on your health and to receive a thorough exam.  . Visit your dentist for a routine exam and preventive care every 6 months. Brush your teeth twice a day and floss once a day. Good oral hygiene prevents tooth decay and gum disease.  . The frequency of eye exams is based on your age, health, family medical history, use  of contact lenses, and other factors. Follow your health care provider's recommendations for frequency of eye exams.  . Eat a healthy diet. Foods like vegetables, fruits, whole grains, low-fat dairy products, and lean protein foods contain the nutrients you need without too many calories. Decrease your intake of foods high in solid fats, added sugars, and salt. Eat  the right amount of calories for you. Get information about a proper diet from your health care provider, if necessary.  . Regular physical exercise is one of the most important things you can do for your health. Most adults should get at least 150 minutes of moderate-intensity exercise (any activity that increases your heart rate and causes you to sweat) each week. In addition, most adults need muscle-strengthening exercises on 2 or more days a week.  Silver Sneakers may be a benefit available to you. To determine eligibility, you may visit the website: www.silversneakers.com or contact program at 8167489304 Mon-Fri between 8AM-8PM.   . Maintain a healthy weight. The body mass index (BMI) is a screening tool to identify possible weight problems. It provides an estimate of body fat based on height and weight. Your health care provider can find your BMI and can help you achieve or maintain a healthy weight.   For adults 20 years and older: ? A BMI below 18.5 is considered underweight. ? A BMI of 18.5 to 24.9 is normal. ? A BMI of 25 to 29.9 is considered overweight. ? A BMI of 30 and above is considered obese.   . Maintain normal blood lipids and cholesterol levels by exercising and minimizing your intake of saturated fat. Eat a balanced diet with plenty of fruit and vegetables. Blood tests for lipids and cholesterol should begin at age 36 and  be repeated every 5 years. If your lipid or cholesterol levels are high, you are over 50, or you are at high risk for heart disease, you may need your cholesterol levels checked more frequently. Ongoing high lipid and cholesterol levels should be treated with medicines if diet and exercise are not working.  . If you smoke, find out from your health care provider how to quit. If you do not use tobacco, please do not start.  . If you choose to drink alcohol, please do not consume more than 2 drinks per day. One drink is considered to be 12 ounces (355 mL)  of beer, 5 ounces (148 mL) of wine, or 1.5 ounces (44 mL) of liquor.  . If you are 51-50 years old, ask your health care provider if you should take aspirin to prevent strokes.  . Use sunscreen. Apply sunscreen liberally and repeatedly throughout the day. You should seek shade when your shadow is shorter than you. Protect yourself by wearing long sleeves, pants, a wide-brimmed hat, and sunglasses year round, whenever you are outdoors.  . Once a month, do a whole body skin exam, using a mirror to look at the skin on your back. Tell your health care provider of new moles, moles that have irregular borders, moles that are larger than a pencil eraser, or moles that have changed in shape or color.

## 2018-10-26 NOTE — Progress Notes (Signed)
.   Subjective:   Jennifer Holt is a 58 y.o. female who presents for an Initial Medicare Annual Wellness Visit.  Review of Systems    N/A  Cardiac Risk Factors include: obesity (BMI >30kg/m2);dyslipidemia;hypertension     Objective:    Today's Vitals   10/26/18 1049  BP: (!) 144/100  Pulse: (!) 57  Temp: 97.7 F (36.5 C)  TempSrc: Oral  SpO2: 99%  Weight: 218 lb 12 oz (99.2 kg)  Height: 5' 5.5" (1.664 m)  PainSc: 0-No pain   Body mass index is 35.85 kg/m.  Advanced Directives 10/26/2018 10/19/2018 04/20/2018 09/08/2017 07/21/2017 08/18/2016  Does Patient Have a Medical Advance Directive? No No No No No No  Would patient like information on creating a medical advance directive? No - Patient declined No - Patient declined No - Patient declined No - Patient declined No - Patient declined No - Patient declined    Current Medications (verified) Outpatient Encounter Medications as of 10/26/2018  Medication Sig  . ALPRAZolam (XANAX) 0.5 MG tablet TAKE 1 TABLET BY MOUTH 3 TIMES A DAY AS NEEDED  . amitriptyline (ELAVIL) 10 MG tablet TAKE 1 TO 2 TABLETS BY MOUTH 30 MINUTES BEFORE BED  . atorvastatin (LIPITOR) 40 MG tablet Take 1 tablet (40 mg total) by mouth at bedtime.  Marland Kitchen buPROPion (WELLBUTRIN SR) 150 MG 12 hr tablet TAKE 1 TABLET BY MOUTH TWICE A DAY  . cetirizine (ZYRTEC) 10 MG tablet Take 10 mg by mouth daily as needed.   . fluticasone (FLONASE) 50 MCG/ACT nasal spray Place 2 sprays into both nostrils daily as needed.   . hydrochlorothiazide (HYDRODIURIL) 25 MG tablet TAKE 1 TABLET BY MOUTH ONCE DAILY  . meloxicam (MOBIC) 15 MG tablet Take 1 tablet (15 mg total) by mouth daily.  . metoprolol tartrate (LOPRESSOR) 50 MG tablet TAKE 1 TABLET BY MOUTH 2 TIMES DAILY  . traMADol (ULTRAM) 50 MG tablet TAKE 1 TABLET BY MOUTH EVERY 6 HOURS AS NEEDED   No facility-administered encounter medications on file as of 10/26/2018.     Allergies (verified) Ace inhibitors and Prednisone    History: Past Medical History:  Diagnosis Date  . Allergic rhinitis due to pollen   . Blood transfusion without reported diagnosis   . FIGO stage II endometrial cancer [facility])    [facility]. + malignant cells in peritoneal fluid.   Marland Kitchen GERD (gastroesophageal reflux disease)   . Hyperlipidemia   . Hypertension   . Major depressive disorder, recurrent episode with anxious distress ([place])   . Osteoarthritis, multiple sites   . Panic attacks   . Personal history of radiation therapy 2015   uterine ca  . Uterine cancer ([place]) 2014   Past Surgical History:  Procedure Laterality Date  . ABDOMINAL HYSTERECTOMY    . TONSILLECTOMY    . TONSILLECTOMY AND ADENOIDECTOMY    . TOTAL ABDOMINAL HYSTERECTOMY W/ BILATERAL SALPINGOOPHORECTOMY     BSO   Family History  Problem Relation Age of Onset  . Arthritis Mother   . Hyperlipidemia Mother   . Stroke Mother   . Hypertension Mother   . Mental illness Mother   . Hyperlipidemia Father   . Heart disease Father   . Hypertension Father   . Mental illness Brother   . Stroke Maternal Grandmother   . Hypertension Maternal Grandmother   . Mental illness Brother   . Breast cancer Sister   . Breast cancer Paternal Aunt   . Cancer Paternal Aunt  Social History   Socioeconomic History  . Marital status: Divorced    Spouse name: Not on file  . Number of children: Not on file  . Years of education: Not on file  . Highest education level: Not on file  Occupational History  . Not on file  Social Needs  . Financial resource strain: Not on file  . Food insecurity:    Worry: Not on file    Inability: Not on file  . Transportation needs:    Medical: Not on file    Non-medical: Not on file  Tobacco Use  . Smoking status: Never Smoker  . Smokeless tobacco: Never Used  Substance and Sexual Activity  . Alcohol use: Yes    Comment: occassionally  . Drug use: No  . Sexual activity: Yes    Birth control/protection: Surgical  Lifestyle  .  Physical activity:    Days per week: Not on file    Minutes per session: Not on file  . Stress: Not on file  Relationships  . Social connections:    Talks on phone: Not on file    Gets together: Not on file    Attends religious service: Not on file    Active member of club or organization: Not on file    Attends meetings of clubs or organizations: Not on file    Relationship status: Not on file  Other Topics Concern  . Not on file  Social History Narrative  . Not on file    Tobacco Counseling Counseling given: No   Clinical Intake:  Pre-visit preparation completed: Yes  Pain : No/denies pain Pain Score: 0-No pain     Nutritional Status: BMI > 30  Obese Nutritional Risks: None Diabetes: No  How often do you need to have someone help you when you read instructions, pamphlets, or other written materials from your doctor or pharmacy?: 1 - Never What is the last grade level you completed in school?: 12th grade  Interpreter Needed?: No  Comments: pt lives alone Information entered by :: LPinson, LPN   Activities of Daily Living In your present state of health, do you have any difficulty performing the following activities: 10/26/2018  Hearing? N  Vision? N  Difficulty concentrating or making decisions? N  Walking or climbing stairs? N  Dressing or bathing? N  Doing errands, shopping? N  Preparing Food and eating ? N  Using the Toilet? N  In the past six months, have you accidently leaked urine? N  Do you have problems with loss of bowel control? N  Managing your Medications? N  Managing your Finances? N  Housekeeping or managing your Housekeeping? N  Some recent data might be hidden     Immunizations and Health Maintenance  There is no immunization history on file for this patient. There are no preventive care reminders to display for this patient.  Patient Care Team: Owens Loffler, MD as PCP - General (Family Medicine)  Indicate any recent Medical  Services you may have received from other than [facility] providers in the past year (date may be approximate).     Assessment:   This is a routine wellness examination for Jennifer Holt.  Hearing/Vision screen  Hearing Screening   125Hz  250Hz  500Hz  1000Hz  2000Hz  3000Hz  4000Hz  6000Hz  8000Hz   Right ear:   40 40 40  40    Left ear:   40 40 40  40      Visual Acuity Screening   Right eye Left eye Both  eyes  Without correction:     With correction: 20/25 20/25-1 20/25    Dietary issues and exercise activities discussed: Current Exercise Habits: The patient does not participate in regular exercise at present, Exercise limited by: None identified  Goals    . Patient Stated     Starting 10/26/2018, I will continue to take medications as prescribed.       Depression Screen PHQ 2/9 Scores 10/26/2018 04/25/2018 12/21/2016  PHQ - 2 Score 4 6 6   PHQ- 9 Score 14 16 23     Fall Risk Fall Risk  10/26/2018  Falls in the past year? 0   Cognitive Function: MMSE - Mini Mental State Exam 10/26/2018  Orientation to time 5  Orientation to Place 5  Registration 3  Attention/ Calculation 0  Recall 3  Language- name 2 objects 0  Language- repeat 1  Language- follow 3 step command 3  Language- read & follow direction 0  Write a sentence 0  Copy design 0  Total score 20       PLEASE NOTE: A Mini-Cog screen was completed. Maximum score is 20. A value of 0 denotes this part of [place] MMSE was not completed or the patient failed this part of the Mini-Cog screening.   Mini-Cog Screening Orientation to Time - Max 5 pts Orientation to Place - Max 5 pts Registration - Max 3 pts Recall - Max 3 pts Language Repeat - Max 1 pts Language Follow 3 Step Command - Max 3 pts   Screening Tests Health Maintenance  Topic Date Due  . INFLUENZA VACCINE  10/31/2018 (Originally 04/28/2018)  . COLONOSCOPY  09/28/2019 (Originally 02/13/2009)  . TETANUS/TDAP  09/28/2019 (Originally 02/13/1978)  . MAMMOGRAM  07/07/2020  .  Hepatitis C Screening  Completed  . HIV Screening  Completed  . PAP SMEAR-Modifier  Discontinued     Plan:     I have personally reviewed, addressed, and noted the following in the patient's chart:  A. Medical and social history B. Use of alcohol, tobacco or illicit drugs  C. Current medications and supplements D. Functional ability and status E.  Nutritional status F.  Physical activity G. Advance directives H. List of other physicians I.  Hospitalizations, surgeries, and ER visits in previous 12 months J.  [place] to include hearing, vision, cognitive, depression L. Referrals and appointments - none  In addition, I have reviewed and discussed with patient certain preventive protocols, quality metrics, and best practice recommendations. A written personalized care plan for preventive services as well as general preventive health recommendations were provided to patient.  See attached scanned questionnaire for additional information.   Signed,   Lindell Noe, MHA, BS, LPN Health Coach

## 2018-10-27 LAB — HIV ANTIBODY (ROUTINE TESTING W REFLEX): HIV 1&2 Ab, 4th Generation: NONREACTIVE

## 2018-10-27 LAB — HEPATITIS C ANTIBODY
HEP C AB: NONREACTIVE
SIGNAL TO CUT-OFF: 0.02 (ref <1.00)

## 2018-10-28 LAB — CA 125: CA 125: 6 U/mL (ref <35)

## 2018-10-30 ENCOUNTER — Encounter: Payer: Self-pay | Admitting: Family Medicine

## 2018-10-30 NOTE — Progress Notes (Signed)
Dr. Frederico Hamman T. Dailen Mcclish, MD, [facility] [place], [address] Phone: [phone] Fax: [phone]  10/31/2018  Patient: Jennifer Holt, MRN: [id number], DOB: 1961/01/16, 58 y.o.  Primary Physician:  Owens Loffler, MD   Chief Complaint  Patient presents with  . Annual Exam    Part 2   Subjective:   Jennifer Holt is a 58 y.o. very pleasant female patient who presents with the following:  F/u multiple medical problems Medicare Wellness with Jennifer Holt.  HTN: Tolerating all medications without side effects No CP, no sob. No HA.  BP Readings from Last 3 Encounters:  10/31/18 (!) 162/84  10/26/18 (!) 144/100  10/19/18 (!) 155/95   BP is high   Basic Metabolic Panel:    Component Value Date/Time   NA 138 10/26/2018 1049   NA 137 11/07/2013 1615   K 4.2 10/26/2018 1049   K 4.1 11/07/2013 1615   CL 100 10/26/2018 1049   CL 105 11/07/2013 1615   CO2 29 10/26/2018 1049   CO2 28 11/07/2013 1615   BUN 17 10/26/2018 1049   BUN 18 11/07/2013 1615   CREATININE 0.89 10/26/2018 1049   CREATININE 0.92 08/15/2014 0925   GLUCOSE 115 (H) 10/26/2018 1049   GLUCOSE 95 11/07/2013 1615   CALCIUM 9.4 10/26/2018 1049   CALCIUM 9.7 11/07/2013 1615    Lipids: Doing well, stable. Tolerating meds fine with no SE. Panel reviewed with patient.  Lipids:    Component Value Date/Time   CHOL 167 10/26/2018 1049   TRIG 258.0 (H) 10/26/2018 1049   HDL 43.20 10/26/2018 1049   LDLDIRECT 86.0 10/26/2018 1049   VLDL 51.6 (H) 10/26/2018 1049   CHOLHDL 4 10/26/2018 1049    Lab Results  Component Value Date   ALT 24 10/26/2018   AST 20 10/26/2018   ALKPHOS 88 10/26/2018   BILITOT 0.7 10/26/2018    Depression and anxiety, on Wellbutrin, Elavil, xanax  Recently good f/u for endometrial cancer  Wt Readings from Last 3 Encounters:  10/31/18 219 lb (99.3 kg)  10/26/18 218 lb 12 oz (99.2 kg)  10/19/18 218 lb (98.9 kg)     Past Medical History, Surgical History, Social History, Family History, Problem List, Medications, and Allergies have been reviewed and updated if relevant.  Patient Active Problem List   Diagnosis Date Noted  . FIGO stage II endometrial cancer ([place])     Priority: High  . Hypertension   . Hyperlipidemia   . Allergic rhinitis due to pollen   . GERD (gastroesophageal reflux disease)   . Major depressive disorder, recurrent episode with anxious distress ([place])   . Panic attacks   . Osteoarthritis, multiple sites     Past Medical History:  Diagnosis Date  . Allergic rhinitis due to pollen   . Blood transfusion without reported diagnosis   . FIGO stage II endometrial cancer [facility])    [facility]. + malignant cells in peritoneal fluid.   Marland Kitchen GERD (gastroesophageal reflux disease)   . Hyperlipidemia   . Hypertension   . Major depressive disorder, recurrent episode with anxious distress ([place])   . Osteoarthritis, multiple sites   . Panic attacks   . Personal history of radiation therapy 2015   uterine ca    Past Surgical History:  Procedure Laterality Date  . ABDOMINAL HYSTERECTOMY    . TONSILLECTOMY    . TONSILLECTOMY AND ADENOIDECTOMY    . TOTAL ABDOMINAL HYSTERECTOMY W/  BILATERAL SALPINGOOPHORECTOMY     BSO    Social History   Socioeconomic History  . Marital status: Divorced    Spouse name: Not on file  . Number of children: Not on file  . Years of education: Not on file  . Highest education level: Not on file  Occupational History  . Not on file  Social Needs  . Financial resource strain: Not on file  . Food insecurity:    Worry: Not on file    Inability: Not on file  . Transportation needs:    Medical: Not on file    Non-medical: Not on file  Tobacco Use  . Smoking status: Never Smoker  . Smokeless tobacco: Never Used  Substance and Sexual Activity  . Alcohol use: Yes    Comment: occassionally  . Drug use: No  . Sexual activity: Yes    Birth  control/protection: Surgical  Lifestyle  . Physical activity:    Days per week: Not on file    Minutes per session: Not on file  . Stress: Not on file  Relationships  . Social connections:    Talks on phone: Not on file    Gets together: Not on file    Attends religious service: Not on file    Active member of club or organization: Not on file    Attends meetings of clubs or organizations: Not on file    Relationship status: Not on file  . Intimate partner violence:    Fear of current or ex partner: Not on file    Emotionally abused: Not on file    Physically abused: Not on file    Forced sexual activity: Not on file  Other Topics Concern  . Not on file  Social History Narrative  . Not on file    Family History  Problem Relation Age of Onset  . Arthritis Mother   . Hyperlipidemia Mother   . Stroke Mother   . Hypertension Mother   . Mental illness Mother   . Hyperlipidemia Father   . Heart disease Father   . Hypertension Father   . Mental illness Brother   . Stroke Maternal Grandmother   . Hypertension Maternal Grandmother   . Mental illness Brother   . Breast cancer Sister   . Breast cancer Paternal Aunt   . Cancer Paternal Aunt     Allergies  Allergen Reactions  . Ace Inhibitors Swelling    Angioedema.  . Prednisone Other (See Comments)    Flu like symptoms    Medication list reviewed and updated in full in [place].   GEN: No acute illnesses, no fevers, chills. GI: No n/v/d, eating normally Pulm: No SOB Interactive and getting along well at home.  Otherwise, ROS is as per the HPI.  Objective:   BP (!) 162/84   Pulse 72   Temp 98.1 F (36.7 C) (Oral)   Ht 5' 5.5" (1.664 m)   Wt 219 lb (99.3 kg)   BMI 35.89 kg/m   GEN: WDWN, NAD, Non-toxic, A & O x 3 HEENT: Atraumatic, Normocephalic. Neck supple. No masses, No LAD. Ears and Nose: No external deformity. CV: RRR, No M/G/R. No JVD. No thrill. No extra heart sounds. PULM: CTA B, no  wheezes, crackles, rhonchi. No retractions. No resp. distress. No accessory muscle use. EXTR: No c/c/e NEURO Normal gait.  PSYCH: Normally interactive. Conversant. Not depressed or anxious appearing.  Calm demeanor.   Laboratory and Imaging Data: Results for orders  placed or performed in visit on 10/26/18  Lipid Panel  Result Value Ref Range   Cholesterol 167 0 - 200 mg/dL   Triglycerides 258.0 (H) 0.0 - 149.0 mg/dL   HDL 43.20 >39.00 mg/dL   VLDL 51.6 (H) 0.0 - 40.0 mg/dL   Total CHOL/HDL Ratio 4    NonHDL 124.00   Hemoglobin A1c  Result Value Ref Range   Hgb A1c MFr Bld 5.8 4.6 - 6.5 %  Hepatic Function Panel  Result Value Ref Range   Total Bilirubin 0.7 0.2 - 1.2 mg/dL   Bilirubin, Direct 0.1 0.0 - 0.3 mg/dL   Alkaline Phosphatase 88 39 - 117 U/L   AST 20 0 - 37 U/L   ALT 24 0 - 35 U/L   Total Protein 7.2 6.0 - 8.3 g/dL   Albumin 4.4 3.5 - 5.2 g/dL  CBC with Differential/Platelet  Result Value Ref Range   WBC 5.9 4.0 - 10.5 K/uL   RBC 3.91 3.87 - 5.11 Mil/uL   Hemoglobin 12.5 12.0 - 15.0 g/dL   HCT 36.7 36.0 - 46.0 %   MCV 93.8 78.0 - 100.0 fl   MCHC 34.1 30.0 - 36.0 g/dL   RDW 12.6 11.5 - 15.5 %   Platelets 255.0 150.0 - 400.0 K/uL   Neutrophils Relative % 62.1 43.0 - 77.0 %   Lymphocytes Relative 25.8 12.0 - 46.0 %   Monocytes Relative 9.4 3.0 - 12.0 %   Eosinophils Relative 1.8 0.0 - 5.0 %   Basophils Relative 0.9 0.0 - 3.0 %   Neutro Abs 3.7 1.4 - 7.7 K/uL   Lymphs Abs 1.5 0.7 - 4.0 K/uL   Monocytes Absolute 0.6 0.1 - 1.0 K/uL   Eosinophils Absolute 0.1 0.0 - 0.7 K/uL   Basophils Absolute 0.1 0.0 - 0.1 K/uL  Basic Metabolic Panel  Result Value Ref Range   Sodium 138 135 - 145 mEq/L   Potassium 4.2 3.5 - 5.1 mEq/L   Chloride 100 96 - 112 mEq/L   CO2 29 19 - 32 mEq/L   Glucose, Bld 115 (H) 70 - 99 mg/dL   BUN 17 6 - 23 mg/dL   Creatinine, Ser 0.89 0.40 - 1.20 mg/dL   Calcium 9.4 8.4 - 10.5 mg/dL   GFR 64.76 >60.00 mL/min  TSH  Result Value Ref Range    TSH 3.39 0.35 - 4.50 uIU/mL  Vitamin D, 25-hydroxy  Result Value Ref Range   VITD 57.77 30.00 - 100.00 ng/mL  CA 125  Result Value Ref Range   CA 125 6 <35 U/mL  Hepatitis C antibody  Result Value Ref Range   Hepatitis C Ab NON-REACTIVE NON-REACTI   SIGNAL TO CUT-OFF 0.02 <1.00  HIV antibody (with reflex)  Result Value Ref Range   HIV 1&2 Ab, 4th Generation NON-REACTIVE NON-REACTI  LDL cholesterol, direct  Result Value Ref Range   Direct LDL 86.0 mg/dL     Assessment and Plan:   Essential hypertension  Major depressive disorder, recurrent episode with anxious distress (HCC)  Panic attacks  Mixed hyperlipidemia  Primary osteoarthritis involving multiple joints  FIGO stage II endometrial cancer (HCC)  Unstable blood pressure, increase Lopressor dosing.  Check at home 2-3 times weekly, call then if elevated.  Mild hypertriglyceridemia, she is been to work on diet changes.  Otherwise she is doing reasonably okay.  She is having some hot flashes, anxiety and depression are doing better than before.  We will keep her Wellbutrin at current dosing.  She  does have some dry mouth and eyes, therefore I am and I recommend she stop taking her Elavil.  Follow-up: 1 yr  Meds ordered this encounter  Medications  . metoprolol tartrate (LOPRESSOR) 100 MG tablet    Sig: Take 1 tablet (100 mg total) by mouth 2 (two) times daily.    Dispense:  180 tablet    Refill:  3   No orders of the defined types were placed in this encounter.   Signed,  Maud Deed. Zari Cly, MD   Outpatient Encounter Medications as of 10/31/2018  Medication Sig  . ALPRAZolam (XANAX) 0.5 MG tablet TAKE 1 TABLET BY MOUTH 3 TIMES A DAY AS NEEDED  . atorvastatin (LIPITOR) 40 MG tablet Take 1 tablet (40 mg total) by mouth at bedtime.  Marland Kitchen buPROPion (WELLBUTRIN SR) 150 MG 12 hr tablet TAKE 1 TABLET BY MOUTH TWICE A DAY  . cetirizine (ZYRTEC) 10 MG tablet Take 10 mg by mouth daily as needed.   . fluticasone  (FLONASE) 50 MCG/ACT nasal spray Place 2 sprays into both nostrils daily as needed.   . hydrochlorothiazide (HYDRODIURIL) 25 MG tablet TAKE 1 TABLET BY MOUTH ONCE DAILY  . meloxicam (MOBIC) 15 MG tablet Take 1 tablet (15 mg total) by mouth daily.  . traMADol (ULTRAM) 50 MG tablet TAKE 1 TABLET BY MOUTH EVERY 6 HOURS AS NEEDED  . [DISCONTINUED] amitriptyline (ELAVIL) 10 MG tablet TAKE 1 TO 2 TABLETS BY MOUTH 30 MINUTES BEFORE BED  . [DISCONTINUED] metoprolol tartrate (LOPRESSOR) 50 MG tablet TAKE 1 TABLET BY MOUTH 2 TIMES DAILY  . metoprolol tartrate (LOPRESSOR) 100 MG tablet Take 1 tablet (100 mg total) by mouth 2 (two) times daily.   No facility-administered encounter medications on file as of 10/31/2018.

## 2018-10-31 ENCOUNTER — Encounter: Payer: Self-pay | Admitting: Family Medicine

## 2018-10-31 ENCOUNTER — Ambulatory Visit (INDEPENDENT_AMBULATORY_CARE_PROVIDER_SITE_OTHER): Payer: Medicare Other | Admitting: Family Medicine

## 2018-10-31 VITALS — BP 162/84 | HR 72 | Temp 98.1°F | Ht 65.5 in | Wt 219.0 lb

## 2018-10-31 DIAGNOSIS — C541 Malignant neoplasm of endometrium: Secondary | ICD-10-CM

## 2018-10-31 DIAGNOSIS — I10 Essential (primary) hypertension: Secondary | ICD-10-CM | POA: Diagnosis not present

## 2018-10-31 DIAGNOSIS — M159 Polyosteoarthritis, unspecified: Secondary | ICD-10-CM

## 2018-10-31 DIAGNOSIS — F339 Major depressive disorder, recurrent, unspecified: Secondary | ICD-10-CM | POA: Diagnosis not present

## 2018-10-31 DIAGNOSIS — E782 Mixed hyperlipidemia: Secondary | ICD-10-CM | POA: Diagnosis not present

## 2018-10-31 DIAGNOSIS — F410 Panic disorder [episodic paroxysmal anxiety] without agoraphobia: Secondary | ICD-10-CM | POA: Diagnosis not present

## 2018-10-31 DIAGNOSIS — M150 Primary generalized (osteo)arthritis: Secondary | ICD-10-CM

## 2018-10-31 MED ORDER — METOPROLOL TARTRATE 100 MG PO TABS: 100.0000 mg | ORAL_TABLET | Freq: Two times a day (BID) | ORAL | 3 refills | Status: DC

## 2018-10-31 NOTE — Patient Instructions (Addendum)
With your old metoprolol prescription, increase it to 2 tablets a day (100 mg twice a day)  I sent in a new prescription for 100 mg tablets, which is once twice a day.  Check your BP about twice a week, call me if your BP is higher at 140/85 persistently.

## 2018-11-01 ENCOUNTER — Other Ambulatory Visit: Payer: Self-pay | Admitting: Family Medicine

## 2018-11-01 ENCOUNTER — Encounter: Payer: Self-pay | Admitting: *Deleted

## 2018-11-01 NOTE — Telephone Encounter (Signed)
Last office visit 10/31/2018 for CPE.  Last refilled 04/15/2018 for #90 with 1 refill.

## 2018-11-17 ENCOUNTER — Telehealth: Payer: Self-pay | Admitting: Family Medicine

## 2018-11-17 NOTE — Telephone Encounter (Signed)
Pt's husband dropped off form for parking placard. Placed in [place] tower.

## 2018-11-17 NOTE — Telephone Encounter (Signed)
Handicap Placard Application completed and placed in Dr. Copland's in box for signature. 

## 2018-11-21 ENCOUNTER — Other Ambulatory Visit: Payer: Self-pay | Admitting: Family Medicine

## 2018-11-21 MED ORDER — AMITRIPTYLINE HCL 10 MG PO TABS: ORAL_TABLET | ORAL | 1 refills | Status: DC

## 2018-11-21 NOTE — Telephone Encounter (Signed)
refilled 

## 2018-11-21 NOTE — Telephone Encounter (Signed)
Jennifer Holt notified by telephone that her handicap placard application is ready to be picked up at the front desk.  While on the phone Keosha stated that Dr. Lorelei Pont and her decided at her last office visit to stop her amitriptyline.  She states she thinks she needs to go back on it due to when she stopped it she started having problems again.  She is asking for a refill to [facility].

## 2018-11-25 ENCOUNTER — Other Ambulatory Visit: Payer: Self-pay | Admitting: Family Medicine

## 2018-12-21 ENCOUNTER — Other Ambulatory Visit: Payer: Self-pay | Admitting: Family Medicine

## 2018-12-21 DIAGNOSIS — F339 Major depressive disorder, recurrent, unspecified: Secondary | ICD-10-CM

## 2018-12-21 DIAGNOSIS — F410 Panic disorder [episodic paroxysmal anxiety] without agoraphobia: Secondary | ICD-10-CM

## 2019-01-03 ENCOUNTER — Other Ambulatory Visit: Payer: Self-pay | Admitting: Family Medicine

## 2019-01-03 DIAGNOSIS — I10 Essential (primary) hypertension: Secondary | ICD-10-CM

## 2019-01-04 ENCOUNTER — Other Ambulatory Visit: Payer: Self-pay | Admitting: *Deleted

## 2019-01-04 MED ORDER — TRAMADOL HCL 50 MG PO TABS: 50.0000 mg | ORAL_TABLET | Freq: Four times a day (QID) | ORAL | 3 refills | Status: DC | PRN

## 2019-01-04 MED ORDER — ALPRAZOLAM 0.5 MG PO TABS: 0.5000 mg | ORAL_TABLET | Freq: Three times a day (TID) | ORAL | 3 refills | Status: DC | PRN

## 2019-01-04 NOTE — Telephone Encounter (Signed)
Last office visit 10/31/2018 for CPE.  Last refilled Tramadol 10/03/2018 for #60 with 3 refills.  Alprazolam 10/03/2018 for #90 with 3 refills.  Next Appt: 05/03/2019 for 6 month follow up.

## 2019-01-14 ENCOUNTER — Other Ambulatory Visit: Payer: Self-pay | Admitting: Family Medicine

## 2019-05-02 ENCOUNTER — Other Ambulatory Visit: Payer: Self-pay | Admitting: *Deleted

## 2019-05-02 MED ORDER — MELOXICAM 15 MG PO TABS: 15.0000 mg | ORAL_TABLET | Freq: Every day | ORAL | 1 refills | Status: DC

## 2019-05-02 NOTE — Telephone Encounter (Signed)
Last office visit 10/31/2018 for CPE.  Last refilled 11/01/2018 for #90 with 1 refill.  Next Appt: 05/10/2019 for 6 month follow up.

## 2019-05-03 ENCOUNTER — Ambulatory Visit: Payer: Medicare Other | Admitting: Family Medicine

## 2019-05-09 NOTE — Progress Notes (Signed)
Carlie Solorzano T. Tyreik Delahoussaye, MD [facility] and [place] [facility] [place], [address] Phone: [phone]  FAX: [phone]  JAQUANNA BALLENTINE - 58 y.o. female  MRN [id number]  Date of Birth: 12-09-1960  Visit Date: 05/10/2019  PCP: Owens Loffler, MD  Referred by: Owens Loffler, MD Chief Complaint  Patient presents with  . Follow-up    6 month   Virtual Visit via Video Note:  I connected with  Dorthula Rue on 05/10/2019 10:40 AM EDT by a video enabled telemedicine application and verified that I am speaking with the correct person using two identifiers.   Location patient: home computer, tablet, or smartphone Location provider: work or home office Consent: Verbal consent directly obtained from Dorthula Rue. Persons participating in the virtual visit: patient, provider  I discussed the limitations of evaluation and management by telemedicine and the availability of in person appointments. The patient expressed understanding and agreed to proceed.  History of Present Illness:  F/u multiple medical problems:  She has FIGO stage 2 endometrial cancer. Current on f/u with Oncology  HTN: Tolerating all medications without side effects Stable and at goal No CP, no sob. No HA.  BP Readings from Last 3 Encounters:  10/31/18 (!) 162/84  10/26/18 (!) 144/100  10/19/18 (!) 094/70    Basic Metabolic Panel:    Component Value Date/Time   NA 138 10/26/2018 1049   NA 137 11/07/2013 1615   K 4.2 10/26/2018 1049   K 4.1 11/07/2013 1615   CL 100 10/26/2018 1049   CL 105 11/07/2013 1615   CO2 29 10/26/2018 1049   CO2 28 11/07/2013 1615   BUN 17 10/26/2018 1049   BUN 18 11/07/2013 1615   CREATININE 0.89 10/26/2018 1049   CREATININE 0.92 08/15/2014 0925   GLUCOSE 115 (H) 10/26/2018 1049   GLUCOSE 95 11/07/2013 1615   CALCIUM 9.4 10/26/2018 1049   CALCIUM 9.7 11/07/2013 1615    Lipids: Doing well, stable.  Tolerating meds fine with no SE. Panel reviewed with patient.  Lipids:    Component Value Date/Time   CHOL 167 10/26/2018 1049   TRIG 258.0 (H) 10/26/2018 1049   HDL 43.20 10/26/2018 1049   LDLDIRECT 86.0 10/26/2018 1049   VLDL 51.6 (H) 10/26/2018 1049   CHOLHDL 4 10/26/2018 1049    Lab Results  Component Value Date   ALT 24 10/26/2018   AST 20 10/26/2018   ALKPHOS 88 10/26/2018   BILITOT 0.7 10/26/2018     Anxiety has increased some during the Covid-19 pandemic   Review of Systems as above: See pertinent positives and pertinent negatives per HPI No acute distress verbally  Past Medical History, Surgical History, Social History, Family History, Problem List, Medications, and Allergies have been reviewed and updated if relevant.   Observations/Objective/Exam:  An attempt was made to discern vital signs over the phone and per patient if applicable and possible.   General:    Alert, Oriented, appears well and in no acute distress HEENT:     Atraumatic, conjunctiva clear, no obvious abnormalities on inspection of external nose and ears.  Neck:    Normal movements of the head and neck Pulmonary:     On inspection no signs of respiratory distress, breathing rate appears normal, no obvious gross SOB, gasping or wheezing Cardiovascular:    No obvious cyanosis Musculoskeletal:    Moves all visible extremities without noticeable abnormality Psych / Neurological:  Pleasant and cooperative, no obvious depression or anxiety, speech and thought processing grossly intact  Assessment and Plan:    ICD-10-CM   1. Essential hypertension  I10   2. Major depressive disorder, recurrent episode with anxious distress ([place])  F33.9   3. Panic attacks  F41.0   4. FIGO stage II endometrial cancer (HCC)  C54.1   5. Pure hypercholesterolemia  E78.00    She will check her BP as able  Increased stress has worsened panic attacks some  I discussed the assessment and treatment plan with  the patient. The patient was provided an opportunity to ask questions and all were answered. The patient agreed with the plan and demonstrated an understanding of the instructions.   The patient was advised to call back or seek an in-person evaluation if the symptoms worsen or if the condition fails to improve as anticipated.  Follow-up: prn unless noted otherwise below No follow-ups on file.  Meds ordered this encounter  Medications  . traMADol (ULTRAM) 50 MG tablet    Sig: Take 1 tablet (50 mg total) by mouth every 6 (six) hours as needed.    Dispense:  60 tablet    Refill:  3   No orders of the defined types were placed in this encounter.   Signed,  Maud Deed. Paymon Rosensteel, MD

## 2019-05-10 ENCOUNTER — Encounter: Payer: Self-pay | Admitting: Family Medicine

## 2019-05-10 ENCOUNTER — Ambulatory Visit (INDEPENDENT_AMBULATORY_CARE_PROVIDER_SITE_OTHER): Payer: Medicare Other | Admitting: Family Medicine

## 2019-05-10 ENCOUNTER — Other Ambulatory Visit: Payer: Self-pay

## 2019-05-10 ENCOUNTER — Other Ambulatory Visit: Payer: Self-pay | Admitting: Family Medicine

## 2019-05-10 VITALS — Ht 65.5 in

## 2019-05-10 DIAGNOSIS — F339 Major depressive disorder, recurrent, unspecified: Secondary | ICD-10-CM

## 2019-05-10 DIAGNOSIS — F410 Panic disorder [episodic paroxysmal anxiety] without agoraphobia: Secondary | ICD-10-CM | POA: Diagnosis not present

## 2019-05-10 DIAGNOSIS — C541 Malignant neoplasm of endometrium: Secondary | ICD-10-CM | POA: Diagnosis not present

## 2019-05-10 DIAGNOSIS — E7800 Pure hypercholesterolemia, unspecified: Secondary | ICD-10-CM

## 2019-05-10 DIAGNOSIS — I10 Essential (primary) hypertension: Secondary | ICD-10-CM | POA: Diagnosis not present

## 2019-05-10 MED ORDER — TRAMADOL HCL 50 MG PO TABS: 50.0000 mg | ORAL_TABLET | Freq: Four times a day (QID) | ORAL | 3 refills | Status: DC | PRN

## 2019-07-12 ENCOUNTER — Other Ambulatory Visit: Payer: Self-pay | Admitting: Family Medicine

## 2019-07-12 DIAGNOSIS — F410 Panic disorder [episodic paroxysmal anxiety] without agoraphobia: Secondary | ICD-10-CM

## 2019-07-12 DIAGNOSIS — F339 Major depressive disorder, recurrent, unspecified: Secondary | ICD-10-CM

## 2019-07-12 MED ORDER — HYDROCHLOROTHIAZIDE 25 MG PO TABS: 25.0000 mg | ORAL_TABLET | Freq: Every day | ORAL | 1 refills | Status: DC

## 2019-07-12 NOTE — Telephone Encounter (Signed)
Last office visit 05/10/2019 for 6 month follow up.  Last refilled 01/04/2019 for #90 with 3 refills.  CPE scheduled for 11/06/2019.

## 2019-07-12 NOTE — Addendum Note (Signed)
Addended by: Carter Kitten on: 07/12/2019 03:21 PM   Modules accepted: Orders

## 2019-08-08 ENCOUNTER — Other Ambulatory Visit: Payer: Self-pay | Admitting: Family Medicine

## 2019-09-13 ENCOUNTER — Other Ambulatory Visit: Payer: Self-pay | Admitting: Family Medicine

## 2019-09-13 NOTE — Telephone Encounter (Signed)
Last office visit 05/10/2019 for 6 month follow up.  Last refilled 05/10/2019 for #60 with 3 refills.  CPE scheduled for 11/06/2019.

## 2019-10-11 ENCOUNTER — Other Ambulatory Visit: Payer: Self-pay | Admitting: Family Medicine

## 2019-10-11 NOTE — Telephone Encounter (Signed)
Last office visit 05/10/2019 for 6 month follow up.  Last refilled 07/12/2019 for #90 with 3 refills.  CPE scheduled for 11/06/2019.

## 2019-10-30 ENCOUNTER — Other Ambulatory Visit: Payer: Self-pay | Admitting: Family Medicine

## 2019-10-30 ENCOUNTER — Ambulatory Visit: Payer: Medicare Other

## 2019-10-30 DIAGNOSIS — C541 Malignant neoplasm of endometrium: Secondary | ICD-10-CM

## 2019-10-30 DIAGNOSIS — E785 Hyperlipidemia, unspecified: Secondary | ICD-10-CM

## 2019-10-30 DIAGNOSIS — Z79899 Other long term (current) drug therapy: Secondary | ICD-10-CM

## 2019-10-30 DIAGNOSIS — E559 Vitamin D deficiency, unspecified: Secondary | ICD-10-CM

## 2019-10-30 DIAGNOSIS — R5383 Other fatigue: Secondary | ICD-10-CM

## 2019-10-31 ENCOUNTER — Ambulatory Visit (INDEPENDENT_AMBULATORY_CARE_PROVIDER_SITE_OTHER): Payer: Medicare Other

## 2019-10-31 ENCOUNTER — Other Ambulatory Visit: Payer: Self-pay

## 2019-10-31 ENCOUNTER — Other Ambulatory Visit (INDEPENDENT_AMBULATORY_CARE_PROVIDER_SITE_OTHER): Payer: Medicare Other

## 2019-10-31 DIAGNOSIS — Z0000 Encounter for general adult medical examination without abnormal findings: Secondary | ICD-10-CM | POA: Diagnosis not present

## 2019-10-31 DIAGNOSIS — C541 Malignant neoplasm of endometrium: Secondary | ICD-10-CM

## 2019-10-31 DIAGNOSIS — R5383 Other fatigue: Secondary | ICD-10-CM | POA: Diagnosis not present

## 2019-10-31 DIAGNOSIS — Z79899 Other long term (current) drug therapy: Secondary | ICD-10-CM | POA: Diagnosis not present

## 2019-10-31 DIAGNOSIS — E559 Vitamin D deficiency, unspecified: Secondary | ICD-10-CM

## 2019-10-31 DIAGNOSIS — E785 Hyperlipidemia, unspecified: Secondary | ICD-10-CM

## 2019-10-31 LAB — HEPATIC FUNCTION PANEL
ALT: 28 U/L (ref 0–35)
AST: 22 U/L (ref 0–37)
Albumin: 4.3 g/dL (ref 3.5–5.2)
Alkaline Phosphatase: 72 U/L (ref 39–117)
Bilirubin, Direct: 0.1 mg/dL (ref 0.0–0.3)
Total Bilirubin: 0.4 mg/dL (ref 0.2–1.2)
Total Protein: 6.7 g/dL (ref 6.0–8.3)

## 2019-10-31 LAB — CBC WITH DIFFERENTIAL/PLATELET
Basophils Absolute: 0 10*3/uL (ref 0.0–0.1)
Basophils Relative: 0.9 % (ref 0.0–3.0)
Eosinophils Absolute: 0.1 10*3/uL (ref 0.0–0.7)
Eosinophils Relative: 1.7 % (ref 0.0–5.0)
HCT: 35.9 % — ABNORMAL LOW (ref 36.0–46.0)
Hemoglobin: 12.2 g/dL (ref 12.0–15.0)
Lymphocytes Relative: 38.8 % (ref 12.0–46.0)
Lymphs Abs: 1.6 10*3/uL (ref 0.7–4.0)
MCHC: 34 g/dL (ref 30.0–36.0)
MCV: 97 fl (ref 78.0–100.0)
Monocytes Absolute: 0.4 10*3/uL (ref 0.1–1.0)
Monocytes Relative: 10.9 % (ref 3.0–12.0)
Neutro Abs: 1.9 10*3/uL (ref 1.4–7.7)
Neutrophils Relative %: 47.7 % (ref 43.0–77.0)
Platelets: 261 10*3/uL (ref 150.0–400.0)
RBC: 3.7 Mil/uL — ABNORMAL LOW (ref 3.87–5.11)
RDW: 13 % (ref 11.5–15.5)
WBC: 4 10*3/uL (ref 4.0–10.5)

## 2019-10-31 LAB — BASIC METABOLIC PANEL
BUN: 26 mg/dL — ABNORMAL HIGH (ref 6–23)
CO2: 30 mEq/L (ref 19–32)
Calcium: 9.4 mg/dL (ref 8.4–10.5)
Chloride: 102 mEq/L (ref 96–112)
Creatinine, Ser: 1 mg/dL (ref 0.40–1.20)
GFR: 56.42 mL/min — ABNORMAL LOW (ref >60.00)
Glucose, Bld: 112 mg/dL — ABNORMAL HIGH (ref 70–99)
Potassium: 4.1 mEq/L (ref 3.5–5.1)
Sodium: 140 mEq/L (ref 135–145)

## 2019-10-31 LAB — VITAMIN D 25 HYDROXY (VIT D DEFICIENCY, FRACTURES): VITD: 47.31 ng/mL (ref 30.00–100.00)

## 2019-10-31 LAB — LIPID PANEL
Cholesterol: 156 mg/dL (ref 0–200)
HDL: 40.8 mg/dL (ref >39.00)
Total CHOL/HDL Ratio: 4
Triglycerides: 579 mg/dL — ABNORMAL HIGH (ref 0.0–149.0)

## 2019-10-31 LAB — TSH: TSH: 4.53 u[IU]/mL — ABNORMAL HIGH (ref 0.35–4.50)

## 2019-10-31 LAB — LDL CHOLESTEROL, DIRECT: Direct LDL: 50 mg/dL

## 2019-10-31 NOTE — Progress Notes (Signed)
PCP notes:  Health Maintenance:    Abnormal Screenings: Colonoscopy- decline Mammogram- declined Flu vaccine- declined   Patient concerns: none   Nurse concerns: none   Next PCP appt.: 11/06/2019 @ 10:20 am

## 2019-10-31 NOTE — Patient Instructions (Signed)
Ms. Jurica , Thank you for taking time to come for your Medicare Wellness Visit. I appreciate your ongoing commitment to your health goals. Please review the following plan we discussed and let me know if I can assist you in the future.   Screening recommendations/referrals: Colonoscopy: declined Mammogram: declined Bone Density: @ age 60 Recommended yearly ophthalmology/optometry visit for glaucoma screening and checkup Recommended yearly dental visit for hygiene and checkup  Vaccinations: Influenza vaccine: declined Pneumococcal vaccine: @ age 16 Tdap vaccine: @ age 20 Shingles vaccine: discussed    Advanced directives: Advance directive discussed with you today. Even though you declined this today please call our office should you change your mind and we can give you the proper paperwork for you to fill out.  Conditions/risks identified: hypertension, hyperlipidemia  Next appointment: 11/06/2019 @ 10:20 am    Preventive Care 65 Years and Older, Female Preventive care refers to lifestyle choices and visits with your health care provider that can promote health and wellness. What does preventive care include?  A yearly physical exam. This is also called an annual well check.  Dental exams once or twice a year.  Routine eye exams. Ask your health care provider how often you should have your eyes checked.  Personal lifestyle choices, including:  Daily care of your teeth and gums.  Regular physical activity.  Eating a healthy diet.  Avoiding tobacco and drug use.  Limiting alcohol use.  Practicing safe sex.  Taking low-dose aspirin every day.  Taking vitamin and mineral supplements as recommended by your health care provider. What happens during an annual well check? The services and screenings done by your health care provider during your annual well check will depend on your age, overall health, lifestyle risk factors, and family history of disease. Counseling   Your health care provider may ask you questions about your:  Alcohol use.  Tobacco use.  Drug use.  Emotional well-being.  Home and relationship well-being.  Sexual activity.  Eating habits.  History of falls.  Memory and ability to understand (cognition).  Work and work Statistician.  Reproductive health. Screening  You may have the following tests or measurements:  Height, weight, and BMI.  Blood pressure.  Lipid and cholesterol levels. These may be checked every 5 years, or more frequently if you are over 50 years old.  Skin check.  Lung cancer screening. You may have this screening every year starting at age 75 if you have a 30-pack-year history of smoking and currently smoke or have quit within the past 15 years.  Fecal occult blood test (FOBT) of the stool. You may have this test every year starting at age 52.  Flexible sigmoidoscopy or colonoscopy. You may have a sigmoidoscopy every 5 years or a colonoscopy every 10 years starting at age 1.  Hepatitis C blood test.  Hepatitis B blood test.  Sexually transmitted disease (STD) testing.  Diabetes screening. This is done by checking your blood sugar (glucose) after you have not eaten for a while (fasting). You may have this done every 1-3 years.  Bone density scan. This is done to screen for osteoporosis. You may have this done starting at age 53.  Mammogram. This may be done every 1-2 years. Talk to your health care provider about how often you should have regular mammograms. Talk with your health care provider about your test results, treatment options, and if necessary, the need for more tests. Vaccines  Your health care provider may recommend certain vaccines, such  as:  Influenza vaccine. This is recommended every year.  Tetanus, diphtheria, and acellular pertussis (Tdap, Td) vaccine. You may need a Td booster every 10 years.  Zoster vaccine. You may need this after age 27.  Pneumococcal 13-valent  conjugate (PCV13) vaccine. One dose is recommended after age 101.  Pneumococcal polysaccharide (PPSV23) vaccine. One dose is recommended after age 1. Talk to your health care provider about which screenings and vaccines you need and how often you need them. This information is not intended to replace advice given to you by your health care provider. Make sure you discuss any questions you have with your health care provider. Document Released: 10/11/2015 Document Revised: 06/03/2016 Document Reviewed: 07/16/2015 Elsevier Interactive Patient Education  2017 [place] Prevention in the Home Falls can cause injuries. They can happen to people of all ages. There are many things you can do to make your home safe and to help prevent falls. What can I do on the outside of my home?  Regularly fix the edges of walkways and driveways and fix any cracks.  Remove anything that might make you trip as you walk through a door, such as a raised step or threshold.  Trim any bushes or trees on the path to your home.  Use bright outdoor lighting.  Clear any walking paths of anything that might make someone trip, such as rocks or tools.  Regularly check to see if handrails are loose or broken. Make sure that both sides of any steps have handrails.  Any raised decks and porches should have guardrails on the edges.  Have any leaves, snow, or ice cleared regularly.  Use sand or salt on walking paths during winter.  Clean up any spills in your garage right away. This includes oil or grease spills. What can I do in the bathroom?  Use night lights.  Install grab bars by the toilet and in the tub and shower. Do not use towel bars as grab bars.  Use non-skid mats or decals in the tub or shower.  If you need to sit down in the shower, use a plastic, non-slip stool.  Keep the floor dry. Clean up any water that spills on the floor as soon as it happens.  Remove soap buildup in the tub or  shower regularly.  Attach bath mats securely with double-sided non-slip rug tape.  Do not have throw rugs and other things on the floor that can make you trip. What can I do in the bedroom?  Use night lights.  Make sure that you have a light by your bed that is easy to reach.  Do not use any sheets or blankets that are too big for your bed. They should not hang down onto the floor.  Have a firm chair that has side arms. You can use this for support while you get dressed.  Do not have throw rugs and other things on the floor that can make you trip. What can I do in the kitchen?  Clean up any spills right away.  Avoid walking on wet floors.  Keep items that you use a lot in easy-to-reach places.  If you need to reach something above you, use a strong step stool that has a grab bar.  Keep electrical cords out of the way.  Do not use floor polish or wax that makes floors slippery. If you must use wax, use non-skid floor wax.  Do not have throw rugs and other things on the  floor that can make you trip. What can I do with my stairs?  Do not leave any items on the stairs.  Make sure that there are handrails on both sides of the stairs and use them. Fix handrails that are broken or loose. Make sure that handrails are as long as the stairways.  Check any carpeting to make sure that it is firmly attached to the stairs. Fix any carpet that is loose or worn.  Avoid having throw rugs at the top or bottom of the stairs. If you do have throw rugs, attach them to the floor with carpet tape.  Make sure that you have a light switch at the top of the stairs and the bottom of the stairs. If you do not have them, ask someone to add them for you. What else can I do to help prevent falls?  Wear shoes that:  Do not have high heels.  Have rubber bottoms.  Are comfortable and fit you well.  Are closed at the toe. Do not wear sandals.  If you use a stepladder:  Make sure that it is fully  opened. Do not climb a closed stepladder.  Make sure that both sides of the stepladder are locked into place.  Ask someone to hold it for you, if possible.  Clearly mark and make sure that you can see:  Any grab bars or handrails.  First and last steps.  Where the edge of each step is.  Use tools that help you move around (mobility aids) if they are needed. These include:  Canes.  Walkers.  Scooters.  Crutches.  Turn on the lights when you go into a dark area. Replace any light bulbs as soon as they burn out.  Set up your furniture so you have a clear path. Avoid moving your furniture around.  If any of your floors are uneven, fix them.  If there are any pets around you, be aware of where they are.  Review your medicines with your doctor. Some medicines can make you feel dizzy. This can increase your chance of falling. Ask your doctor what other things that you can do to help prevent falls. This information is not intended to replace advice given to you by your health care provider. Make sure you discuss any questions you have with your health care provider. Document Released: 07/11/2009 Document Revised: 02/20/2016 Document Reviewed: 10/19/2014 Elsevier Interactive Patient Education  2017 [employer].

## 2019-10-31 NOTE — Progress Notes (Signed)
Subjective:   Jennifer Holt is a 59 y.o. female who presents for Medicare Annual (Subsequent) preventive examination.  Review of Systems: N/A   This visit is being conducted through telemedicine via telephone at the nurse health advisor's home address due to the COVID-19 pandemic. This patient has given me verbal consent via doximity to conduct this visit, patient states they are participating from their home address. Patient and myself are on the telephone call. There is no referral for this visit. Some vital signs may be absent or patient reported.    Patient identification: identified by name, DOB, and current address   Cardiac Risk Factors include: hypertension;dyslipidemia     Objective:     Vitals: There were no vitals taken for this visit.  There is no height or weight on file to calculate BMI.  Advanced Directives 10/31/2019 10/26/2018 10/19/2018 04/20/2018 09/08/2017 07/21/2017 08/18/2016  Does Patient Have a Medical Advance Directive? No No No No No No No  Would patient like information on creating a medical advance directive? No - Patient declined No - Patient declined No - Patient declined No - Patient declined No - Patient declined No - Patient declined No - Patient declined    Tobacco Social History   Tobacco Use  Smoking Status Never Smoker  Smokeless Tobacco Never Used     Counseling given: Not Answered   Clinical Intake:  Pre-visit preparation completed: Yes  Pain : 0-10 Pain Score: 8  Pain Type: Chronic pain Pain Location: Back Pain Orientation: Lower Pain Descriptors / Indicators: Aching Pain Onset: More than a month ago Pain Frequency: Intermittent     Nutritional Risks: None Diabetes: No  How often do you need to have someone help you when you read instructions, pamphlets, or other written materials from your doctor or pharmacy?: 1 - Never What is the last grade level you completed in school?: 12th  Interpreter Needed?: No  Information  entered by :: CJohnson, LPN  Past Medical History:  Diagnosis Date  . Allergic rhinitis due to pollen   . Blood transfusion without reported diagnosis   . FIGO stage II endometrial cancer [facility])    [facility]. + malignant cells in peritoneal fluid.   Marland Kitchen GERD (gastroesophageal reflux disease)   . Hyperlipidemia   . Hypertension   . Major depressive disorder, recurrent episode with anxious distress ([place])   . Osteoarthritis, multiple sites   . Panic attacks   . Personal history of radiation therapy 2015   uterine ca   Past Surgical History:  Procedure Laterality Date  . ABDOMINAL HYSTERECTOMY    . TONSILLECTOMY    . TONSILLECTOMY AND ADENOIDECTOMY    . TOTAL ABDOMINAL HYSTERECTOMY W/ BILATERAL SALPINGOOPHORECTOMY     BSO   Family History  Problem Relation Age of Onset  . Arthritis Mother   . Hyperlipidemia Mother   . Stroke Mother   . Hypertension Mother   . Mental illness Mother   . Hyperlipidemia Father   . Heart disease Father   . Hypertension Father   . Mental illness Brother   . Stroke Maternal Grandmother   . Hypertension Maternal Grandmother   . Mental illness Brother   . Breast cancer Sister   . Breast cancer Paternal Aunt   . Cancer Paternal Aunt    Social History   Socioeconomic History  . Marital status: Divorced    Spouse name: Not on file  . Number of children: Not on file  . Years of education: Not  on file  . Highest education level: Not on file  Occupational History  . Not on file  Tobacco Use  . Smoking status: Never Smoker  . Smokeless tobacco: Never Used  Substance and Sexual Activity  . Alcohol use: Yes    Comment: occassionally  . Drug use: No  . Sexual activity: Yes    Birth control/protection: Surgical  Other Topics Concern  . Not on file  Social History Narrative  . Not on file   Social Determinants of Health   Financial Resource Strain: Low Risk   . Difficulty of Paying Living Expenses: Not hard at all  Food Insecurity: No  Food Insecurity  . Worried About Charity fundraiser in the Last Year: Never true  . Ran Out of Food in the Last Year: Never true  Transportation Needs: No Transportation Needs  . Lack of Transportation (Medical): No  . Lack of Transportation (Non-Medical): No  Physical Activity: Inactive  . Days of Exercise per Week: 0 days  . Minutes of Exercise per Session: 0 min  Stress: Stress Concern Present  . Feeling of Stress : To some extent  Social Connections:   . Frequency of Communication with Friends and Family: Not on file  . Frequency of Social Gatherings with Friends and Family: Not on file  . Attends Religious Services: Not on file  . Active Member of Clubs or Organizations: Not on file  . Attends Archivist Meetings: Not on file  . Marital Status: Not on file    Outpatient Encounter Medications as of 10/31/2019  Medication Sig  . ALPRAZolam (XANAX) 0.5 MG tablet TAKE 1 TABLET BY MOUTH 3 TIMES DAILY AS NEEDED  . amitriptyline (ELAVIL) 10 MG tablet TAKE 1 OR 2 TABLETS BY MOUTH 30 MINUTES BEFORE BED  . atorvastatin (LIPITOR) 40 MG tablet TAKE ONE TABLET BY MOUTH AT BEDTIME  . buPROPion (WELLBUTRIN SR) 150 MG 12 hr tablet TAKE 1 TABLET BY MOUTH TWICE A DAY  . cetirizine (ZYRTEC) 10 MG tablet Take 10 mg by mouth daily as needed.   . fluticasone (FLONASE) 50 MCG/ACT nasal spray Place 2 sprays into both nostrils daily as needed.   . hydrochlorothiazide (HYDRODIURIL) 25 MG tablet Take 1 tablet (25 mg total) by mouth daily.  . meloxicam (MOBIC) 15 MG tablet Take 1 tablet (15 mg total) by mouth daily.  . metoprolol tartrate (LOPRESSOR) 100 MG tablet Take 1 tablet (100 mg total) by mouth 2 (two) times daily.  . traMADol (ULTRAM) 50 MG tablet TAKE 1 TABLET BY MOUTH EVERY 6 HOURS AS NEEDED   No facility-administered encounter medications on file as of 10/31/2019.    Activities of Daily Living In your present state of health, do you have any difficulty performing the following  activities: 10/31/2019  Hearing? N  Vision? N  Difficulty concentrating or making decisions? N  Walking or climbing stairs? N  Dressing or bathing? N  Doing errands, shopping? N  Preparing Food and eating ? N  Using the Toilet? N  In the past six months, have you accidently leaked urine? N  Do you have problems with loss of bowel control? N  Managing your Medications? N  Managing your Finances? N  Housekeeping or managing your Housekeeping? N  Some recent data might be hidden    Patient Care Team: Owens Loffler, MD as PCP - General (Family Medicine)    Assessment:   This is a routine wellness examination for Layaan.  Exercise Activities and Dietary  recommendations Current Exercise Habits: The patient does not participate in regular exercise at present, Exercise limited by: None identified  Goals    . Patient Stated     Starting 10/26/2018, I will continue to take medications as prescribed.     . Patient Stated     10/31/2019, I will try to work on losing some weight once the pandemic gets under control.       Fall Risk Fall Risk  10/31/2019 10/26/2018  Falls in the past year? 0 0  Number falls in past yr: 0 -  Injury with Fall? 0 -  Risk for fall due to : Medication side effect -  Follow up Falls evaluation completed;Falls prevention discussed -   Is the patient's home free of loose throw rugs in walkways, pet beds, electrical cords, etc?   yes      Grab bars in the bathroom? no      Handrails on the stairs?   no      Adequate lighting?   yes  Timed Get Up and Go performed: N/A  Depression Screen PHQ 2/9 Scores 10/31/2019 10/26/2018 04/25/2018 12/21/2016  PHQ - 2 Score 4 4 6 6   PHQ- 9 Score 4 14 16 23      Cognitive Function MMSE - Mini Mental State Exam 10/31/2019 10/26/2018  Orientation to time 5 5  Orientation to Place 5 5  Registration 3 3  Attention/ Calculation 5 0  Recall 3 3  Language- name 2 objects - 0  Language- repeat 1 1  Language- follow 3 step  command - 3  Language- read & follow direction - 0  Write a sentence - 0  Copy design - 0  Total score - 20  Mini Cog  Mini-Cog screen was completed. Maximum score is 22. A value of 0 denotes this part of the MMSE was not completed or the patient failed this part of the Mini-Cog screening.        There is no immunization history on file for this patient.  Qualifies for Shingles Vaccine? Yes  Screening Tests Health Maintenance  Topic Date Due  . INFLUENZA VACCINE  12/27/2019 (Originally 04/29/2019)  . COLONOSCOPY  10/30/2020 (Originally 02/13/2009)  . TETANUS/TDAP  10/30/2020 (Originally 02/13/1978)  . MAMMOGRAM  07/07/2020  . Hepatitis C Screening  Completed  . HIV Screening  Completed  . PAP SMEAR-Modifier  Discontinued    Cancer Screenings: Lung: Low Dose CT Chest recommended if Age 59-80 years, 30 pack-year currently smoking OR have quit w/in 15years. Patient does not qualify. Breast:  Up to date on Mammogram? No, declined due to pandemic   Bone Density/Dexa: N/A Colorectal: declined due to pandemic  Additional Screenings:  Hepatitis C Screening: 10/26/2018     Plan:    Patient will try to work on losing some weight when the pandemic gets under control.    I have personally reviewed and noted the following in the patient's chart:   . Medical and social history . Use of alcohol, tobacco or illicit drugs  . Current medications and supplements . Functional ability and status . Nutritional status . Physical activity . Advanced directives . List of other physicians . Hospitalizations, surgeries, and ER visits in previous 12 months . Vitals . Screenings to include cognitive, depression, and falls . Referrals and appointments  In addition, I have reviewed and discussed with patient certain preventive protocols, quality metrics, and best practice recommendations. A written personalized care plan for preventive services as well as general  preventive health  recommendations were provided to patient.     Andrez Grime, LPN  [id number]

## 2019-11-01 ENCOUNTER — Other Ambulatory Visit (INDEPENDENT_AMBULATORY_CARE_PROVIDER_SITE_OTHER): Payer: Medicare Other

## 2019-11-01 DIAGNOSIS — R7989 Other specified abnormal findings of blood chemistry: Secondary | ICD-10-CM | POA: Diagnosis not present

## 2019-11-01 LAB — CA 125: CA 125: 6 U/mL (ref <35)

## 2019-11-01 LAB — T3, FREE: T3, Free: 3.1 pg/mL (ref 2.3–4.2)

## 2019-11-01 LAB — T4, FREE: Free T4: 0.79 ng/dL (ref 0.60–1.60)

## 2019-11-05 DIAGNOSIS — E781 Pure hyperglyceridemia: Secondary | ICD-10-CM | POA: Insufficient documentation

## 2019-11-05 NOTE — Progress Notes (Signed)
Annamarie Yamaguchi T. Chasya Zenz, MD Primary Care and [place] at [facility] [place], [address] Phone: [phone]  FAX: [phone]  Jennifer Holt - 59 y.o. female  MRN [id number]  Date of Birth: [birth year]  Visit Date: 11/06/2019  PCP: Owens Loffler, MD  Referred by: Owens Loffler, MD  Chief Complaint  Patient presents with  . Annual Exam    Part 2    This visit occurred during the SARS-CoV-2 public health emergency.  Safety protocols were in place, including screening questions prior to the visit, additional usage of staff PPE, and extensive cleaning of exam room while observing appropriate contact time as indicated for disinfecting solutions.   Subjective:   Jennifer Holt is a 59 y.o. very pleasant female patient who presents with the following:  She is a pleasant lady and she presents in follow-up for her post Medicare wellness exam.  Has not gotten Covid vaccine.   Back and knee will hurt some  Wt Readings from Last 3 Encounters:  11/06/19 208 lb 4 oz (94.5 kg)  10/31/18 219 lb (99.3 kg)  10/26/18 218 lb 12 oz (99.2 kg)     Her multiple medical problems including stage II endometrial cancer.  Her recent Ca1 to 5 was totally normal.  HTN: Tolerating all medications without side effects Stable and at goal No CP, no sob. No HA.  BP Readings from Last 3 Encounters:  11/06/19 138/84  10/31/18 (!) 162/84  10/26/18 (!) [id number]    Basic Metabolic Panel:    Component Value Date/Time   NA 140 10/31/2019 0853   NA 137 11/07/2013 1615   K 4.1 10/31/2019 0853   K 4.1 11/07/2013 1615   CL 102 10/31/2019 0853   CL 105 11/07/2013 1615   CO2 30 10/31/2019 0853   CO2 28 11/07/2013 1615   BUN 26 (H) 10/31/2019 0853   BUN 18 11/07/2013 1615   CREATININE 1.00 10/31/2019 0853   CREATININE 0.92 08/15/2014 0925   GLUCOSE 112 (H) 10/31/2019 0853   GLUCOSE 95 11/07/2013 1615   CALCIUM 9.4 10/31/2019 0853    CALCIUM 9.7 11/07/2013 1615     Lipids: Doing well, stable. Tolerating meds fine with no SE. Panel reviewed with patient. trigs  Lipids:    Component Value Date/Time   CHOL 156 10/31/2019 0853   TRIG (H) 10/31/2019 0853    579.0 Triglyceride is over 400; calculations on Lipids are invalid.   HDL 40.80 10/31/2019 0853   LDLDIRECT 50.0 10/31/2019 0853   VLDL 51.6 (H) 10/26/2018 1049   CHOLHDL 4 10/31/2019 0853    Lab Results  Component Value Date   ALT 28 10/31/2019   AST 22 10/31/2019   ALKPHOS 72 10/31/2019   BILITOT 0.4 10/31/2019    In the past she has also had some intermittent issues with some depression and some anxiety. She is currently taking some Wellbutrin.  And she also takes some Xanax as needed.  Review of Systems is noted in the HPI, as appropriate  Objective:   BP 138/84   Pulse 84   Temp 98 F (36.7 C) (Temporal)   Ht 5' 4.5" (1.638 m)   Wt 208 lb 4 oz (94.5 kg)   SpO2 99%   BMI 35.19 kg/m   GEN: WDWN, NAD, Non-toxic HEENT: Atraumatic, Normocephalic. Neck supple. No masses. CV: RRR, No M/G/R. No JVD. No thrill. No extra heart sounds. PULM: CTA B, no wheezes, crackles, rhonchi. No  retractions. No resp. distress. No accessory muscle use. EXTR: No c/c/e NEURO Normal gait.  PSYCH: Normally interactive. Conversant.   Laboratory and Imaging Data: Results for orders placed or performed in visit on 11/01/19  T3, free  Result Value Ref Range   T3, Free 3.1 2.3 - 4.2 pg/mL  T4, free  Result Value Ref Range   Free T4 0.79 0.60 - 1.60 ng/dL    CMP Latest Ref Rng & Units 10/31/2019 10/26/2018 07/14/2017  Glucose 70 - 99 mg/dL 112(H) 115(H) 119(H)  BUN 6 - 23 mg/dL 26(H) 17 22  Creatinine 0.40 - 1.20 mg/dL 1.00 0.89 0.88  Sodium 135 - 145 mEq/L 140 138 138  Potassium 3.5 - 5.1 mEq/L 4.1 4.2 5.0  Chloride 96 - 112 mEq/L 102 100 101  CO2 19 - 32 mEq/L 30 29 27   Calcium 8.4 - 10.5 mg/dL 9.4 9.4 10.1  Total Protein 6.0 - 8.3 g/dL 6.7 7.2 7.5  Total  Bilirubin 0.2 - 1.2 mg/dL 0.4 0.7 0.6  Alkaline Phos 39 - 117 U/L 72 88 70  AST 0 - 37 U/L 22 20 19   ALT 0 - 35 U/L 28 24 21    CBC Latest Ref Rng & Units 10/31/2019 10/26/2018 07/14/2017  WBC 4.0 - 10.5 K/uL 4.0 5.9 4.8  Hemoglobin 12.0 - 15.0 g/dL 12.2 12.5 12.8  Hematocrit 36.0 - 46.0 % 35.9(L) 36.7 38.9  Platelets 150.0 - 400.0 K/uL 261.0 255.0 276.0   Lab Results  Component Value Date   CHOL 156 10/31/2019   Lab Results  Component Value Date   HDL 40.80 10/31/2019   No results found for: [facility] Lab Results  Component Value Date   TRIG (H) 10/31/2019    579.0 Triglyceride is over 400; calculations on Lipids are invalid.   Lab Results  Component Value Date   CHOLHDL 4 10/31/2019    Assessment and Plan:     ICD-10-CM   1. High triglycerides  E78.1 fenofibrate (TRICOR) 48 MG tablet  2. FIGO stage II endometrial cancer (HCC)  C54.1   3. Essential hypertension  I10   4. Pure hypercholesterolemia  E78.00    Level of Medical Decision-Making in this case is MODERATE.   Triglycerides are over 500.  Add TriCor.  Blood pressure has stabilized and looks better.  She did have a CA-125 that was normal.  Last time she had endometrial cancer follow-up was 1 year ago.  That point she was released from the GYN clinic.  Follow-up: No follow-ups on file.  Meds ordered this encounter  Medications  . fenofibrate (TRICOR) 48 MG tablet    Sig: Take 1 tablet (48 mg total) by mouth daily.    Dispense:  90 tablet    Refill:  1   There are no discontinued medications. No orders of the defined types were placed in this encounter.   Signed,  Maud Deed. Dragan Tamburrino, MD   Outpatient Encounter Medications as of 11/06/2019  Medication Sig  . ALPRAZolam (XANAX) 0.5 MG tablet TAKE 1 TABLET BY MOUTH 3 TIMES DAILY AS NEEDED  . amitriptyline (ELAVIL) 10 MG tablet TAKE 1 OR 2 TABLETS BY MOUTH 30 MINUTES BEFORE BED  . atorvastatin (LIPITOR) 40 MG tablet TAKE ONE TABLET BY MOUTH AT BEDTIME  .  buPROPion (WELLBUTRIN SR) 150 MG 12 hr tablet TAKE 1 TABLET BY MOUTH TWICE A DAY  . cetirizine (ZYRTEC) 10 MG tablet Take 10 mg by mouth daily as needed.   . fluticasone (FLONASE) 50 MCG/ACT nasal  spray Place 2 sprays into both nostrils daily as needed.   . hydrochlorothiazide (HYDRODIURIL) 25 MG tablet Take 1 tablet (25 mg total) by mouth daily.  . meloxicam (MOBIC) 15 MG tablet Take 1 tablet (15 mg total) by mouth daily.  . metoprolol tartrate (LOPRESSOR) 100 MG tablet Take 1 tablet (100 mg total) by mouth 2 (two) times daily.  . traMADol (ULTRAM) 50 MG tablet TAKE 1 TABLET BY MOUTH EVERY 6 HOURS AS NEEDED  . fenofibrate (TRICOR) 48 MG tablet Take 1 tablet (48 mg total) by mouth daily.   No facility-administered encounter medications on file as of 11/06/2019.

## 2019-11-06 ENCOUNTER — Other Ambulatory Visit: Payer: Self-pay

## 2019-11-06 ENCOUNTER — Ambulatory Visit (INDEPENDENT_AMBULATORY_CARE_PROVIDER_SITE_OTHER): Payer: Medicare Other | Admitting: Family Medicine

## 2019-11-06 ENCOUNTER — Encounter: Payer: Self-pay | Admitting: Family Medicine

## 2019-11-06 VITALS — BP 138/84 | HR 84 | Temp 98.0°F | Ht 64.5 in | Wt 208.2 lb

## 2019-11-06 DIAGNOSIS — C541 Malignant neoplasm of endometrium: Secondary | ICD-10-CM

## 2019-11-06 DIAGNOSIS — I10 Essential (primary) hypertension: Secondary | ICD-10-CM | POA: Diagnosis not present

## 2019-11-06 DIAGNOSIS — E7800 Pure hypercholesterolemia, unspecified: Secondary | ICD-10-CM

## 2019-11-06 DIAGNOSIS — E781 Pure hyperglyceridemia: Secondary | ICD-10-CM

## 2019-11-06 MED ORDER — FENOFIBRATE 48 MG PO TABS: 48.0000 mg | ORAL_TABLET | Freq: Every day | ORAL | 1 refills | Status: DC

## 2019-11-07 ENCOUNTER — Other Ambulatory Visit: Payer: Self-pay | Admitting: Family Medicine

## 2019-11-07 MED ORDER — AMITRIPTYLINE HCL 10 MG PO TABS: ORAL_TABLET | ORAL | 1 refills | Status: DC

## 2019-11-07 NOTE — Telephone Encounter (Signed)
Last office visit 11/06/2019 for CPE.  Last refilled 05/02/2019 for #90 with 1 refill.  No future appointments.

## 2019-12-26 ENCOUNTER — Other Ambulatory Visit: Payer: Self-pay | Admitting: Family Medicine

## 2020-03-29 ENCOUNTER — Other Ambulatory Visit: Payer: Self-pay | Admitting: Family Medicine

## 2020-03-29 DIAGNOSIS — F339 Major depressive disorder, recurrent, unspecified: Secondary | ICD-10-CM

## 2020-03-29 DIAGNOSIS — E781 Pure hyperglyceridemia: Secondary | ICD-10-CM

## 2020-03-29 DIAGNOSIS — F410 Panic disorder [episodic paroxysmal anxiety] without agoraphobia: Secondary | ICD-10-CM

## 2020-05-22 ENCOUNTER — Other Ambulatory Visit: Payer: Self-pay | Admitting: Family Medicine

## 2020-05-22 NOTE — Telephone Encounter (Signed)
Last office visit 11/06/2019 for CPE.  Last refilled 11/08/2019 for #90 with 1 refill on both medications.  No future appointments.

## 2020-05-23 ENCOUNTER — Other Ambulatory Visit: Payer: Self-pay | Admitting: Family Medicine

## 2020-05-24 ENCOUNTER — Other Ambulatory Visit: Payer: Self-pay | Admitting: *Deleted

## 2020-05-24 MED ORDER — TRAMADOL HCL 50 MG PO TABS: 50.0000 mg | ORAL_TABLET | Freq: Four times a day (QID) | ORAL | 3 refills | Status: DC | PRN

## 2020-05-24 NOTE — Telephone Encounter (Signed)
Last office visit 11/06/2019 for CPE.  Last refilled 10/11/2019 for #90 with 3 refills.  No future appointments.

## 2020-05-24 NOTE — Telephone Encounter (Signed)
Last office visit 11/06/2019 for CPE.  Last refilled 09/13/2019 for #60 with 3 refills.  No future appointments.

## 2020-09-26 ENCOUNTER — Other Ambulatory Visit: Payer: Self-pay | Admitting: Family Medicine

## 2020-09-26 NOTE — Telephone Encounter (Signed)
Last office visit 11/06/2019 for CPE.  Last refilled 05/24/2020 for #90 with 3 refills.  No future appointments.

## 2020-11-01 ENCOUNTER — Other Ambulatory Visit: Payer: Self-pay | Admitting: Family Medicine

## 2020-11-01 DIAGNOSIS — E781 Pure hyperglyceridemia: Secondary | ICD-10-CM

## 2020-11-01 MED ORDER — ATORVASTATIN CALCIUM 40 MG PO TABS
40.0000 mg | ORAL_TABLET | Freq: Every day | ORAL | 0 refills | Status: DC
Start: 2020-11-01 — End: 2021-03-10

## 2020-11-01 MED ORDER — METOPROLOL TARTRATE 100 MG PO TABS
100.0000 mg | ORAL_TABLET | Freq: Two times a day (BID) | ORAL | 0 refills | Status: DC
Start: 2020-11-01 — End: 2021-03-10

## 2020-11-01 NOTE — Telephone Encounter (Signed)
Cindy: Please schedule MWV with Nurse and CPE with Dr. Lorelei Pont.  Meloxicam: Last office visit 11/06/2019 for CPE.  Last refilled 05/22/2020 for #90 with 1 refill.  No future appointments.

## 2020-11-01 NOTE — Telephone Encounter (Signed)
Spoke with patient scheduled MWV with nurse and CPE 

## 2020-11-01 NOTE — Telephone Encounter (Signed)
Need a refill sent to [facility] for the prescription lopressor, lipitor

## 2020-11-20 ENCOUNTER — Other Ambulatory Visit: Payer: Medicare Other

## 2020-11-27 ENCOUNTER — Encounter: Payer: Medicare Other | Admitting: Family Medicine

## 2020-11-29 ENCOUNTER — Ambulatory Visit (INDEPENDENT_AMBULATORY_CARE_PROVIDER_SITE_OTHER): Payer: Medicare Other

## 2020-11-29 ENCOUNTER — Other Ambulatory Visit: Payer: Self-pay

## 2020-11-29 DIAGNOSIS — Z0000 Encounter for general adult medical examination without abnormal findings: Secondary | ICD-10-CM

## 2020-11-29 NOTE — Progress Notes (Signed)
PCP notes:  Health Maintenance: Declined all vaccines Colonoscopy- declined Mammogram- declined Dexa- declined   Abnormal Screenings: none   Patient concerns: Anxiety more lately, trouble sleeping sometimes   Nurse concerns: none   Next PCP appt.: 12/23/2020 @ 10:20 am

## 2020-11-29 NOTE — Patient Instructions (Signed)
Jennifer Holt , Thank you for taking time to come for your Medicare Wellness Visit. I appreciate your ongoing commitment to your health goals. Please review the following plan we discussed and let me know if I can assist you in the future.   Screening recommendations/referrals: Colonoscopy: declined Mammogram: declined Bone Density: declined Recommended yearly ophthalmology/optometry visit for glaucoma screening and checkup Recommended yearly dental visit for hygiene and checkup  Vaccinations: Influenza vaccine: declined Pneumococcal vaccine: due at age 28  Tdap vaccine: declined Shingles vaccine: declined  Covid-19: declined  Advanced directives: Advance directive discussed with you today. Even though you declined this today please call our office should you change your mind and we can give you the proper paperwork for you to fill out.  Conditions/risks identified: hypercholesterolemia   Next appointment: Follow up in one year for your annual wellness visit.   Preventive Care 40-64 Years, Female Preventive care refers to lifestyle choices and visits with your health care provider that can promote health and wellness. What does preventive care include?  A yearly physical exam. This is also called an annual well check.  Dental exams once or twice a year.  Routine eye exams. Ask your health care provider how often you should have your eyes checked.  Personal lifestyle choices, including:  Daily care of your teeth and gums.  Regular physical activity.  Eating a healthy diet.  Avoiding tobacco and drug use.  Limiting alcohol use.  Practicing safe sex.  Taking low-dose aspirin daily starting at age 81.  Taking vitamin and mineral supplements as recommended by your health care provider. What happens during an annual well check? The services and screenings done by your health care provider during your annual well check will depend on your age, overall health, lifestyle risk  factors, and family history of disease. Counseling  Your health care provider may ask you questions about your:  Alcohol use.  Tobacco use.  Drug use.  Emotional well-being.  Home and relationship well-being.  Sexual activity.  Eating habits.  Work and work Statistician.  Method of birth control.  Menstrual cycle.  Pregnancy history. Screening  You may have the following tests or measurements:  Height, weight, and BMI.  Blood pressure.  Lipid and cholesterol levels. These may be checked every 5 years, or more frequently if you are over 57 years old.  Skin check.  Lung cancer screening. You may have this screening every year starting at age 38 if you have a 30-pack-year history of smoking and currently smoke or have quit within the past 15 years.  Fecal occult blood test (FOBT) of the stool. You may have this test every year starting at age 59.  Flexible sigmoidoscopy or colonoscopy. You may have a sigmoidoscopy every 5 years or a colonoscopy every 10 years starting at age 17.  Hepatitis C blood test.  Hepatitis B blood test.  Sexually transmitted disease (STD) testing.  Diabetes screening. This is done by checking your blood sugar (glucose) after you have not eaten for a while (fasting). You may have this done every 1-3 years.  Mammogram. This may be done every 1-2 years. Talk to your health care provider about when you should start having regular mammograms. This may depend on whether you have a family history of breast cancer.  BRCA-related cancer screening. This may be done if you have a family history of breast, ovarian, tubal, or peritoneal cancers.  Pelvic exam and Pap test. This may be done every 3 years starting at  age 52. Starting at age 70, this may be done every 5 years if you have a Pap test in combination with an HPV test.  Bone density scan. This is done to screen for osteoporosis. You may have this scan if you are at high risk for  osteoporosis. Discuss your test results, treatment options, and if necessary, the need for more tests with your health care provider. Vaccines  Your health care provider may recommend certain vaccines, such as:  Influenza vaccine. This is recommended every year.  Tetanus, diphtheria, and acellular pertussis (Tdap, Td) vaccine. You may need a Td booster every 10 years.  Zoster vaccine. You may need this after age 55.  Pneumococcal 13-valent conjugate (PCV13) vaccine. You may need this if you have certain conditions and were not previously vaccinated.  Pneumococcal polysaccharide (PPSV23) vaccine. You may need one or two doses if you smoke cigarettes or if you have certain conditions. Talk to your health care provider about which screenings and vaccines you need and how often you need them. This information is not intended to replace advice given to you by your health care provider. Make sure you discuss any questions you have with your health care provider. Document Released: 10/11/2015 Document Revised: 06/03/2016 Document Reviewed: 07/16/2015 Elsevier Interactive Patient Education  2017 [place] Prevention in the Home Falls can cause injuries. They can happen to people of all ages. There are many things you can do to make your home safe and to help prevent falls. What can I do on the outside of my home?  Regularly fix the edges of walkways and driveways and fix any cracks.  Remove anything that might make you trip as you walk through a door, such as a raised step or threshold.  Trim any bushes or trees on the path to your home.  Use bright outdoor lighting.  Clear any walking paths of anything that might make someone trip, such as rocks or tools.  Regularly check to see if handrails are loose or broken. Make sure that both sides of any steps have handrails.  Any raised decks and porches should have guardrails on the edges.  Have any leaves, snow, or ice cleared  regularly.  Use sand or salt on walking paths during winter.  Clean up any spills in your garage right away. This includes oil or grease spills. What can I do in the bathroom?  Use night lights.  Install grab bars by the toilet and in the tub and shower. Do not use towel bars as grab bars.  Use non-skid mats or decals in the tub or shower.  If you need to sit down in the shower, use a plastic, non-slip stool.  Keep the floor dry. Clean up any water that spills on the floor as soon as it happens.  Remove soap buildup in the tub or shower regularly.  Attach bath mats securely with double-sided non-slip rug tape.  Do not have throw rugs and other things on the floor that can make you trip. What can I do in the bedroom?  Use night lights.  Make sure that you have a light by your bed that is easy to reach.  Do not use any sheets or blankets that are too big for your bed. They should not hang down onto the floor.  Have a firm chair that has side arms. You can use this for support while you get dressed.  Do not have throw rugs and other things  on the floor that can make you trip. What can I do in the kitchen?  Clean up any spills right away.  Avoid walking on wet floors.  Keep items that you use a lot in easy-to-reach places.  If you need to reach something above you, use a strong step stool that has a grab bar.  Keep electrical cords out of the way.  Do not use floor polish or wax that makes floors slippery. If you must use wax, use non-skid floor wax.  Do not have throw rugs and other things on the floor that can make you trip. What can I do with my stairs?  Do not leave any items on the stairs.  Make sure that there are handrails on both sides of the stairs and use them. Fix handrails that are broken or loose. Make sure that handrails are as long as the stairways.  Check any carpeting to make sure that it is firmly attached to the stairs. Fix any carpet that is loose  or worn.  Avoid having throw rugs at the top or bottom of the stairs. If you do have throw rugs, attach them to the floor with carpet tape.  Make sure that you have a light switch at the top of the stairs and the bottom of the stairs. If you do not have them, ask someone to add them for you. What else can I do to help prevent falls?  Wear shoes that:  Do not have high heels.  Have rubber bottoms.  Are comfortable and fit you well.  Are closed at the toe. Do not wear sandals.  If you use a stepladder:  Make sure that it is fully opened. Do not climb a closed stepladder.  Make sure that both sides of the stepladder are locked into place.  Ask someone to hold it for you, if possible.  Clearly mark and make sure that you can see:  Any grab bars or handrails.  First and last steps.  Where the edge of each step is.  Use tools that help you move around (mobility aids) if they are needed. These include:  Canes.  Walkers.  Scooters.  Crutches.  Turn on the lights when you go into a dark area. Replace any light bulbs as soon as they burn out.  Set up your furniture so you have a clear path. Avoid moving your furniture around.  If any of your floors are uneven, fix them.  If there are any pets around you, be aware of where they are.  Review your medicines with your doctor. Some medicines can make you feel dizzy. This can increase your chance of falling. Ask your doctor what other things that you can do to help prevent falls. This information is not intended to replace advice given to you by your health care provider. Make sure you discuss any questions you have with your health care provider. Document Released: 07/11/2009 Document Revised: 02/20/2016 Document Reviewed: 10/19/2014 Elsevier Interactive Patient Education  2017 [employer].

## 2020-11-29 NOTE — Progress Notes (Signed)
Subjective:   Jennifer Holt is a 60 y.o. female who presents for Medicare Annual (Subsequent) preventive examination.  Review of Systems: N/A      I connected with the patient today by telephone and verified that I am speaking with the correct person using two identifiers. Location patient: home Location nurse: work Persons participating in the telephone visit: patient, nurse.   I discussed the limitations, risks, security and privacy concerns of performing an evaluation and management service by telephone and the availability of in person appointments. I also discussed with the patient that there may be a patient responsible charge related to this service. The patient expressed understanding and verbally consented to this telephonic visit.        Cardiac Risk Factors include: advanced age (>44men, >85 women);Other (see comment), Risk factor comments: hypercholesterolemia     Objective:    Today's Vitals   There is no height or weight on file to calculate BMI.  Advanced Directives 11/29/2020 10/31/2019 10/26/2018 10/19/2018 04/20/2018 09/08/2017 07/21/2017  Does Patient Have a Medical Advance Directive? No No No No No No No  Does patient want to make changes to medical advance directive? No - Patient declined - - - - - -  Would patient like information on creating a medical advance directive? - No - Patient declined No - Patient declined No - Patient declined No - Patient declined No - Patient declined No - Patient declined    Current Medications (verified) Outpatient Encounter Medications as of 11/29/2020  Medication Sig  . ALPRAZolam (XANAX) 0.5 MG tablet TAKE 1 TABLET BY MOUTH 3 TIMES DAILY AS NEEDED  . amitriptyline (ELAVIL) 10 MG tablet TAKE 1 TO 2 TABLETS BY MOUTH 30 MINUTES BEFORE BED  . atorvastatin (LIPITOR) 40 MG tablet Take 1 tablet (40 mg total) by mouth at bedtime.  Marland Kitchen buPROPion (WELLBUTRIN SR) 150 MG 12 hr tablet TAKE 1 TABLET BY MOUTH TWICE A DAY  . cetirizine  (ZYRTEC) 10 MG tablet Take 10 mg by mouth daily as needed.   . fenofibrate (TRICOR) 48 MG tablet TAKE 1 TABLET BY MOUTH ONCE DAILY  . fluticasone (FLONASE) 50 MCG/ACT nasal spray Place 2 sprays into both nostrils daily as needed.   . hydrochlorothiazide (HYDRODIURIL) 25 MG tablet TAKE 1 TABLET BY MOUTH ONCE A DAY  . meloxicam (MOBIC) 15 MG tablet TAKE 1 TABLET BY MOUTH ONCE A DAY  . metoprolol tartrate (LOPRESSOR) 100 MG tablet Take 1 tablet (100 mg total) by mouth 2 (two) times daily.  . traMADol (ULTRAM) 50 MG tablet Take 1 tablet (50 mg total) by mouth every 6 (six) hours as needed.   No facility-administered encounter medications on file as of 11/29/2020.    Allergies (verified) Ace inhibitors and Prednisone   History: Past Medical History:  Diagnosis Date  . Allergic rhinitis due to pollen   . Blood transfusion without reported diagnosis   . FIGO stage II endometrial cancer [facility])    [facility]. + malignant cells in peritoneal fluid.   Marland Kitchen GERD (gastroesophageal reflux disease)   . Hyperlipidemia   . Hypertension   . Major depressive disorder, recurrent episode with anxious distress ([place])   . Osteoarthritis, multiple sites   . Panic attacks   . Personal history of radiation therapy 2015   uterine ca   Past Surgical History:  Procedure Laterality Date  . ABDOMINAL HYSTERECTOMY    . TONSILLECTOMY    . TONSILLECTOMY AND ADENOIDECTOMY    . TOTAL ABDOMINAL HYSTERECTOMY  W/ BILATERAL SALPINGOOPHORECTOMY     BSO   Family History  Problem Relation Age of Onset  . Arthritis Mother   . Hyperlipidemia Mother   . Stroke Mother   . Hypertension Mother   . Mental illness Mother   . Hyperlipidemia Father   . Heart disease Father   . Hypertension Father   . Mental illness Brother   . Stroke Maternal Grandmother   . Hypertension Maternal Grandmother   . Mental illness Brother   . Breast cancer Sister   . Breast cancer Paternal Aunt   . Cancer Paternal Aunt    Social History    Socioeconomic History  . Marital status: Divorced    Spouse name: Not on file  . Number of children: Not on file  . Years of education: Not on file  . Highest education level: Not on file  Occupational History  . Not on file  Tobacco Use  . Smoking status: Never Smoker  . Smokeless tobacco: Never Used  Vaping Use  . Vaping Use: Never used  Substance and Sexual Activity  . Alcohol use: Yes    Comment: occassionally  . Drug use: No  . Sexual activity: Yes    Birth control/protection: Surgical  Other Topics Concern  . Not on file  Social History Narrative  . Not on file   Social Determinants of Health   Financial Resource Strain: Low Risk   . Difficulty of Paying Living Expenses: Not hard at all  Food Insecurity: No Food Insecurity  . Worried About Charity fundraiser in the Last Year: Never true  . Ran Out of Food in the Last Year: Never true  Transportation Needs: No Transportation Needs  . Lack of Transportation (Medical): No  . Lack of Transportation (Non-Medical): No  Physical Activity: Inactive  . Days of Exercise per Week: 0 days  . Minutes of Exercise per Session: 0 min  Stress: Stress Concern Present  . Feeling of Stress : To some extent  Social Connections: Not on file    Tobacco Counseling Counseling given: Not Answered   Clinical Intake:  Pre-visit preparation completed: Yes  Pain : 0-10 Pain Type: Chronic pain Pain Location: Back Pain Descriptors / Indicators: Aching Pain Onset: More than a month ago Pain Frequency: Intermittent     Nutritional Risks: None Diabetes: No  How often do you need to have someone help you when you read instructions, pamphlets, or other written materials from your doctor or pharmacy?: 1 - Never What is the last grade level you completed in school?: 12th  Diabetic: No Nutrition Risk Assessment:  Has the patient had any N/V/D within the last 2 months?  No  Does the patient have any non-healing wounds?  No   Has the patient had any unintentional weight loss or weight gain?  No   Diabetes:  Is the patient diabetic?  No  If diabetic, was a CBG obtained today?  N/A Did the patient bring in their glucometer from home?  N/A How often do you monitor your CBG's? N/A.   Financial Strains and Diabetes Management:  Are you having any financial strains with the device, your supplies or your medication? N/A.  Does the patient want to be seen by Chronic Care Management for management of their diabetes?  N/A Would the patient like to be referred to a Nutritionist or for Diabetic Management?  N/A    Interpreter Needed?: No  Information entered by :: CJohnson, LPN   Activities of  Daily Living In your present state of health, do you have any difficulty performing the following activities: 11/29/2020  Hearing? N  Vision? N  Difficulty concentrating or making decisions? N  Walking or climbing stairs? N  Dressing or bathing? N  Doing errands, shopping? N  Preparing Food and eating ? N  Using the Toilet? N  In the past six months, have you accidently leaked urine? N  Do you have problems with loss of bowel control? N  Managing your Medications? N  Managing your Finances? N  Housekeeping or managing your Housekeeping? N  Some recent data might be hidden    Patient Care Team: Owens Loffler, MD as PCP - General (Family Medicine)  Indicate any recent Medical Services you may have received from other than [facility] providers in the past year (date may be approximate).     Assessment:   This is a routine wellness examination for Jazmarie.  Hearing/Vision screen  Hearing Screening   125Hz  250Hz  500Hz  1000Hz  2000Hz  3000Hz  4000Hz  6000Hz  8000Hz   Right ear:           Left ear:           Vision Screening Comments: Patient gets annual eye exams  Dietary issues and exercise activities discussed: Current Exercise Habits: The patient does not participate in regular exercise at present, Exercise limited  by: None identified  Goals    . Patient Stated     Starting 10/26/2018, I will continue to take medications as prescribed.     . Patient Stated     10/31/2019, I will try to work on losing some weight once the pandemic gets under control.    . Patient Stated     11/29/2020, I will maintain and continue medications as prescribed.       Depression Screen PHQ 2/9 Scores 11/29/2020 10/31/2019 10/26/2018 04/25/2018 12/21/2016  PHQ - 2 Score 0 4 4 6 6   PHQ- 9 Score 0 4 14 16 23     Fall Risk Fall Risk  11/29/2020 10/31/2019 10/26/2018  Falls in the past year? 0 0 0  Number falls in past yr: 0 0 -  Injury with Fall? 0 0 -  Risk for fall due to : Medication side effect Medication side effect -  Follow up Falls evaluation completed;Falls prevention discussed Falls evaluation completed;Falls prevention discussed -    FALL RISK PREVENTION PERTAINING TO THE HOME:  Any stairs in or around the home? Yes  If so, are there any without handrails? No  Home free of loose throw rugs in walkways, pet beds, electrical cords, etc? Yes  Adequate lighting in your home to reduce risk of falls? Yes   ASSISTIVE DEVICES UTILIZED TO PREVENT FALLS:  Life alert? No  Use of a cane, walker or w/c? No  Grab bars in the bathroom? No  Shower chair or bench in shower? No  Elevated toilet seat or a handicapped toilet? No   TIMED UP AND GO:  Was the test performed? N/A telephone visit .  Cognitive Function: MMSE - Mini Mental State Exam 11/29/2020 10/31/2019 10/26/2018  Orientation to time 5 5 5   Orientation to Place 5 5 5   Registration 3 3 3   Attention/ Calculation 5 5 0  Recall 3 3 3   Language- name 2 objects - - 0  Language- repeat 1 1 1   Language- follow 3 step command - - 3  Language- read & follow direction - - 0  Write a sentence - - 0  Copy  design - - 0  Total score - - 20  Mini Cog  Mini-Cog screen was completed. Maximum score is 22. A value of 0 denotes this part of the MMSE was not completed or the patient  failed this part of the Mini-Cog screening.       Immunizations  There is no immunization history on file for this patient.  TDAP status: Due, Education has been provided regarding the importance of this vaccine. Advised may receive this vaccine at local pharmacy or Health Dept. Aware to provide a copy of the vaccination record if obtained from local pharmacy or Health Dept. Verbalized acceptance and understanding.  Flu Vaccine status: Declined, Education has been provided regarding the importance of this vaccine but patient still declined. Advised may receive this vaccine at local pharmacy or Health Dept. Aware to provide a copy of the vaccination record if obtained from local pharmacy or Health Dept. Verbalized acceptance and understanding.  Pneumococcal vaccine status: N/A, due at age 24   Covid-19 vaccine status: Declined, Education has been provided regarding the importance of this vaccine but patient still declined. Advised may receive this vaccine at local pharmacy or Health Dept.or vaccine clinic. Aware to provide a copy of the vaccination record if obtained from local pharmacy or Health Dept. Verbalized acceptance and understanding.  Qualifies for Shingles Vaccine? Yes   Zostavax completed No   Shingrix Completed: declined  Screening Tests Health Maintenance  Topic Date Due  . INFLUENZA VACCINE  12/26/2020 (Originally 04/28/2020)  . MAMMOGRAM  11/29/2021 (Originally 07/07/2020)  . COLONOSCOPY (Pts 45-39yrs Insurance coverage will need to be confirmed)  11/29/2021 (Originally 02/14/2004)  . COVID-19 Vaccine (1) 12/15/2021 (Originally [birth year]1971)  . TETANUS/TDAP  11/30/2023 (Originally 02/13/1978)  . Hepatitis C Screening  Completed  . HIV Screening  Completed  . HPV VACCINES  Aged Out  . PAP SMEAR-Modifier  Discontinued    Health Maintenance  There are no preventive care reminders to display for this patient.  Colorectal cancer screening: declined  Mammogram status:  declined  Bone Density status: declined  Lung Cancer Screening: (Low Dose CT Chest recommended if Age 44-80 years, 30 pack-year currently smoking OR have quit w/in 15years.) does not qualify.    Additional Screening:  Hepatitis C Screening: does qualify; Completed 10/26/2018  Vision Screening: Recommended annual ophthalmology exams for early detection of glaucoma and other disorders of the eye. Is the patient up to date with their annual eye exam?  Yes  Who is the provider or what is the name of the office in which the patient attends annual eye exams? In [place]  If pt is not established with a provider, would they like to be referred to a provider to establish care? No .   Dental Screening: Recommended annual dental exams for proper oral hygiene  Community Resource Referral / Chronic Care Management: CRR required this visit?  No   CCM required this visit?  No      Plan:     I have personally reviewed and noted the following in the patient's chart:   . Medical and social history . Use of alcohol, tobacco or illicit drugs  . Current medications and supplements . Functional ability and status . Nutritional status . Physical activity . Advanced directives . List of other physicians . Hospitalizations, surgeries, and ER visits in previous 12 months . Vitals . Screenings to include cognitive, depression, and falls . Referrals and appointments  In addition, I have reviewed and discussed with patient certain  preventive protocols, quality metrics, and best practice recommendations. A written personalized care plan for preventive services as well as general preventive health recommendations were provided to patient.   Due to this being a telephonic visit, the after visit summary with patients personalized plan was offered to patient via office or my-chart. Patient preferred to pick up at office at next visit or via mychart.   Andrez Grime, LPN   11/06/8335

## 2020-12-02 ENCOUNTER — Other Ambulatory Visit: Payer: Medicare Other

## 2020-12-09 ENCOUNTER — Encounter: Payer: Medicare Other | Admitting: Family Medicine

## 2020-12-16 ENCOUNTER — Other Ambulatory Visit: Payer: Self-pay

## 2020-12-16 ENCOUNTER — Other Ambulatory Visit (INDEPENDENT_AMBULATORY_CARE_PROVIDER_SITE_OTHER): Payer: Medicare Other

## 2020-12-16 DIAGNOSIS — C541 Malignant neoplasm of endometrium: Secondary | ICD-10-CM | POA: Diagnosis not present

## 2020-12-16 DIAGNOSIS — E039 Hypothyroidism, unspecified: Secondary | ICD-10-CM

## 2020-12-16 DIAGNOSIS — Z79899 Other long term (current) drug therapy: Secondary | ICD-10-CM

## 2020-12-16 DIAGNOSIS — E559 Vitamin D deficiency, unspecified: Secondary | ICD-10-CM | POA: Diagnosis not present

## 2020-12-16 DIAGNOSIS — E785 Hyperlipidemia, unspecified: Secondary | ICD-10-CM | POA: Diagnosis not present

## 2020-12-16 LAB — BASIC METABOLIC PANEL
BUN: 32 mg/dL — ABNORMAL HIGH (ref 6–23)
CO2: 30 mEq/L (ref 19–32)
Calcium: 9.6 mg/dL (ref 8.4–10.5)
Chloride: 102 mEq/L (ref 96–112)
Creatinine, Ser: 1.09 mg/dL (ref 0.40–1.20)
GFR: 54.7 mL/min — ABNORMAL LOW (ref >60.00)
Glucose, Bld: 122 mg/dL — ABNORMAL HIGH (ref 70–99)
Potassium: 3.8 mEq/L (ref 3.5–5.1)
Sodium: 141 mEq/L (ref 135–145)

## 2020-12-16 LAB — HEPATIC FUNCTION PANEL
ALT: 18 U/L (ref 0–35)
AST: 17 U/L (ref 0–37)
Albumin: 4.6 g/dL (ref 3.5–5.2)
Alkaline Phosphatase: 64 U/L (ref 39–117)
Bilirubin, Direct: 0.1 mg/dL (ref 0.0–0.3)
Total Bilirubin: 0.4 mg/dL (ref 0.2–1.2)
Total Protein: 6.9 g/dL (ref 6.0–8.3)

## 2020-12-16 LAB — CBC WITH DIFFERENTIAL/PLATELET
Basophils Absolute: 0 10*3/uL (ref 0.0–0.1)
Basophils Relative: 0.8 % (ref 0.0–3.0)
Eosinophils Absolute: 0.1 10*3/uL (ref 0.0–0.7)
Eosinophils Relative: 2.1 % (ref 0.0–5.0)
HCT: 36.6 % (ref 36.0–46.0)
Hemoglobin: 12.5 g/dL (ref 12.0–15.0)
Lymphocytes Relative: 30.3 % (ref 12.0–46.0)
Lymphs Abs: 1.7 10*3/uL (ref 0.7–4.0)
MCHC: 34.1 g/dL (ref 30.0–36.0)
MCV: 93.2 fl (ref 78.0–100.0)
Monocytes Absolute: 0.4 10*3/uL (ref 0.1–1.0)
Monocytes Relative: 7.8 % (ref 3.0–12.0)
Neutro Abs: 3.2 10*3/uL (ref 1.4–7.7)
Neutrophils Relative %: 59 % (ref 43.0–77.0)
Platelets: 300 10*3/uL (ref 150.0–400.0)
RBC: 3.92 Mil/uL (ref 3.87–5.11)
RDW: 12.7 % (ref 11.5–15.5)
WBC: 5.5 10*3/uL (ref 4.0–10.5)

## 2020-12-16 LAB — TSH: TSH: 3.46 u[IU]/mL (ref 0.35–4.50)

## 2020-12-16 LAB — LIPID PANEL
Cholesterol: 195 mg/dL (ref 0–200)
HDL: 56 mg/dL (ref >39.00)
NonHDL: 138.91
Total CHOL/HDL Ratio: 3
Triglycerides: 234 mg/dL — ABNORMAL HIGH (ref 0.0–149.0)
VLDL: 46.8 mg/dL — ABNORMAL HIGH (ref 0.0–40.0)

## 2020-12-16 LAB — T4, FREE: Free T4: 0.75 ng/dL (ref 0.60–1.60)

## 2020-12-16 LAB — LDL CHOLESTEROL, DIRECT: Direct LDL: 102 mg/dL

## 2020-12-16 LAB — T3, FREE: T3, Free: 3.4 pg/mL (ref 2.3–4.2)

## 2020-12-16 LAB — VITAMIN D 25 HYDROXY (VIT D DEFICIENCY, FRACTURES): VITD: 85.38 ng/mL (ref 30.00–100.00)

## 2020-12-17 LAB — CA 125: CA 125: 5 U/mL (ref <35)

## 2020-12-23 ENCOUNTER — Ambulatory Visit (INDEPENDENT_AMBULATORY_CARE_PROVIDER_SITE_OTHER): Payer: Medicare Other | Admitting: Family Medicine

## 2020-12-23 ENCOUNTER — Encounter: Payer: Self-pay | Admitting: Family Medicine

## 2020-12-23 ENCOUNTER — Other Ambulatory Visit: Payer: Self-pay

## 2020-12-23 VITALS — BP 172/84 | HR 74 | Temp 96.4°F | Ht 64.5 in | Wt 204.5 lb

## 2020-12-23 DIAGNOSIS — E7800 Pure hypercholesterolemia, unspecified: Secondary | ICD-10-CM

## 2020-12-23 DIAGNOSIS — F339 Major depressive disorder, recurrent, unspecified: Secondary | ICD-10-CM

## 2020-12-23 DIAGNOSIS — I10 Essential (primary) hypertension: Secondary | ICD-10-CM

## 2020-12-23 DIAGNOSIS — R0600 Dyspnea, unspecified: Secondary | ICD-10-CM | POA: Diagnosis not present

## 2020-12-23 DIAGNOSIS — F410 Panic disorder [episodic paroxysmal anxiety] without agoraphobia: Secondary | ICD-10-CM | POA: Diagnosis not present

## 2020-12-23 DIAGNOSIS — E781 Pure hyperglyceridemia: Secondary | ICD-10-CM

## 2020-12-23 DIAGNOSIS — R000 Tachycardia, unspecified: Secondary | ICD-10-CM

## 2020-12-23 DIAGNOSIS — R0609 Other forms of dyspnea: Secondary | ICD-10-CM

## 2020-12-23 MED ORDER — AMITRIPTYLINE HCL 25 MG PO TABS
25.0000 mg | ORAL_TABLET | Freq: Every day | ORAL | 3 refills | Status: DC
Start: 2020-12-23 — End: 2021-10-06

## 2020-12-23 MED ORDER — FENOFIBRATE 160 MG PO TABS
160.0000 mg | ORAL_TABLET | Freq: Every day | ORAL | 3 refills | Status: DC
Start: 2020-12-23 — End: 2021-12-03

## 2020-12-23 NOTE — Progress Notes (Signed)
Jennifer Uffelman T. Donshay Lupinski, MD, [place] at [facility] [place], [address]  Phone: [phone]  FAX: [phone]  [name] - 60 y.o. female  MRN [id number]  Date of Birth: [birth year]  Date: 12/23/2020  PCP: Owens Loffler, MD  Referral: Owens Loffler, MD  Chief Complaint  Patient presents with  . Annual Exam    Part 2    This visit occurred during the SARS-CoV-2 public health emergency.  Safety protocols were in place, including screening questions prior to the visit, additional usage of staff PPE, and extensive cleaning of exam room while observing appropriate contact time as indicated for disinfecting solutions.   Subjective:   Jennifer Holt is a 60 y.o. very pleasant female patient with Body mass index is 34.56 kg/m. who presents with the following:  She is here to follow-up on a number of medical problems after her Medicare wellness exam.  HTN: Tolerating all medications without side effects Very high today No CP, no sob. No HA.  BP Readings from Last 3 Encounters:  12/23/20 (!) 172/84  11/06/19 138/84  10/31/18 (!) 162/84   BP high -on my recheck, this is much lower. Increased anxiety End of January with Covid. 142/88  Just does feel well Tired a lot.  Dep up and down, but this is not unusual. She does think her anxiety has gotten worse.   She does feel like she gets short-winded quite a bit worse, this is been progressive over time. DOE with going up stairs and walking around in the yard.  She also has intermittent tachycardia at rest, and she thinks that this can last up to 30 seconds or so, but it is not just 1 or 2 beats.  Toenail ?  Does not feel well.  She did have an ingrown toenail.  Basic Metabolic Panel:    Component Value Date/Time   NA 141 12/16/2020 1100   NA 137 11/07/2013 1615   K 3.8 12/16/2020 1100   K 4.1 11/07/2013 1615    CL 102 12/16/2020 1100   CL 105 11/07/2013 1615   CO2 30 12/16/2020 1100   CO2 28 11/07/2013 1615   BUN 32 (H) 12/16/2020 1100   BUN 18 11/07/2013 1615   CREATININE 1.09 12/16/2020 1100   CREATININE 0.92 08/15/2014 0925   GLUCOSE 122 (H) 12/16/2020 1100   GLUCOSE 95 11/07/2013 1615   CALCIUM 9.6 12/16/2020 1100   CALCIUM 9.7 11/07/2013 1615    Lipids: Doing well, stable. Tolerating meds fine with no SE. Panel reviewed with patient. She is currently taking TriCor as well as Lipitor.  Lipids: Lab Results  Component Value Date   CHOL 195 12/16/2020   Lab Results  Component Value Date   HDL 56.00 12/16/2020   No results found for: [facility] Lab Results  Component Value Date   TRIG 234.0 (H) 12/16/2020   Lab Results  Component Value Date   CHOLHDL 3 12/16/2020    Lab Results  Component Value Date   ALT 18 12/16/2020   AST 17 12/16/2020   ALKPHOS 64 12/16/2020   BILITOT 0.4 12/16/2020    Anxiety and depression has been a long-term problem, is relatively stabilized now.  She is on Wellbutrin SR 1 tablet p.o. twice daily at a dosing of 150 mg. She also takes Xanax 0.5 mg p.o. 3 times daily.  Review of Systems is noted in the HPI, as appropriate  Objective:   BP (!) 172/84   Pulse 74   Temp (!) 96.4 F (35.8 C) (Temporal)   Ht 5' 4.5" (1.638 m)   Wt 204 lb 8 oz (92.8 kg)   SpO2 93%   BMI 34.56 kg/m   GEN: No acute distress; alert,appropriate. PSYCH: Normally interactive.  CV: RRR, no m/g/r  PULM: Normal respiratory rate, no accessory muscle use. No wheezes, crackles or rhonchi   Laboratory and Imaging Data: Results for orders placed or performed in visit on 12/16/20  Lipid panel  Result Value Ref Range   Cholesterol 195 0 - 200 mg/dL   Triglycerides 234.0 (H) 0.0 - 149.0 mg/dL   HDL 56.00 >39.00 mg/dL   VLDL 46.8 (H) 0.0 - 40.0 mg/dL   Total CHOL/HDL Ratio 3    NonHDL 138.91   Hepatic function panel  Result Value Ref Range   Total Bilirubin 0.4 0.2 -  1.2 mg/dL   Bilirubin, Direct 0.1 0.0 - 0.3 mg/dL   Alkaline Phosphatase 64 39 - 117 U/L   AST 17 0 - 37 U/L   ALT 18 0 - 35 U/L   Total Protein 6.9 6.0 - 8.3 g/dL   Albumin 4.6 3.5 - 5.2 g/dL  Basic metabolic panel  Result Value Ref Range   Sodium 141 135 - 145 mEq/L   Potassium 3.8 3.5 - 5.1 mEq/L   Chloride 102 96 - 112 mEq/L   CO2 30 19 - 32 mEq/L   Glucose, Bld 122 (H) 70 - 99 mg/dL   BUN 32 (H) 6 - 23 mg/dL   Creatinine, Ser 1.09 0.40 - 1.20 mg/dL   GFR 54.70 (L) >60.00 mL/min   Calcium 9.6 8.4 - 10.5 mg/dL  CBC with Differential/Platelet  Result Value Ref Range   WBC 5.5 4.0 - 10.5 K/uL   RBC 3.92 3.87 - 5.11 Mil/uL   Hemoglobin 12.5 12.0 - 15.0 g/dL   HCT 36.6 36.0 - 46.0 %   MCV 93.2 78.0 - 100.0 fl   MCHC 34.1 30.0 - 36.0 g/dL   RDW 12.7 11.5 - 15.5 %   Platelets 300.0 150.0 - 400.0 K/uL   Neutrophils Relative % 59.0 43.0 - 77.0 %   Lymphocytes Relative 30.3 12.0 - 46.0 %   Monocytes Relative 7.8 3.0 - 12.0 %   Eosinophils Relative 2.1 0.0 - 5.0 %   Basophils Relative 0.8 0.0 - 3.0 %   Neutro Abs 3.2 1.4 - 7.7 K/uL   Lymphs Abs 1.7 0.7 - 4.0 K/uL   Monocytes Absolute 0.4 0.1 - 1.0 K/uL   Eosinophils Absolute 0.1 0.0 - 0.7 K/uL   Basophils Absolute 0.0 0.0 - 0.1 K/uL  T3, free  Result Value Ref Range   T3, Free 3.4 2.3 - 4.2 pg/mL  T4, free  Result Value Ref Range   Free T4 0.75 0.60 - 1.60 ng/dL  TSH  Result Value Ref Range   TSH 3.46 0.35 - 4.50 uIU/mL  VITAMIN D 25 Hydroxy (Vit-D Deficiency, Fractures)  Result Value Ref Range   VITD 85.38 30.00 - 100.00 ng/mL  CA 125  Result Value Ref Range   CA 125 5 <35 U/mL  LDL cholesterol, direct  Result Value Ref Range   Direct LDL 102.0 mg/dL     Assessment and Plan:     ICD-10-CM   1. DOE (dyspnea on exertion)  R06.00 Ambulatory referral to Cardiology  2. Essential hypertension  I10   3. High triglycerides  E78.1 fenofibrate 160 MG  tablet  4. Major depressive disorder, recurrent episode with anxious  distress ([place])  F33.9   5. Panic attacks  F41.0   6. Pure hypercholesterolemia  E78.00   7. Tachycardia  R00.0 Ambulatory referral to Cardiology    EKG 12-Lead   Most concerning I think is her progressive DOE, tachycardia.  Risk factors from a cardiac standpoint are weight, FH, lipids, HTN. I am going to involve Cardiology.  EKG: Normal sinus rhythm. Normal axis, normal R wave progression, No acute ST elevation or depression. Inverted T waves, nonspecific.  WIth anxiety, cont wellbutrin and try higher level of Elavil.   Thyroid normal.  Increase Tricor dose - trigs.  Meds ordered this encounter  Medications  . fenofibrate 160 MG tablet    Sig: Take 1 tablet (160 mg total) by mouth daily.    Dispense:  90 tablet    Refill:  3  . amitriptyline (ELAVIL) 25 MG tablet    Sig: Take 1 tablet (25 mg total) by mouth at bedtime.    Dispense:  90 tablet    Refill:  3   Medications Discontinued During This Encounter  Medication Reason  . fenofibrate (TRICOR) 48 MG tablet   . amitriptyline (ELAVIL) 10 MG tablet    Orders Placed This Encounter  Procedures  . Ambulatory referral to Cardiology  . EKG 12-Lead    Follow-up: No follow-ups on file.  Signed,  Maud Deed. Shamya Macfadden, MD   Outpatient Encounter Medications as of 12/23/2020  Medication Sig  . ALPRAZolam (XANAX) 0.5 MG tablet TAKE 1 TABLET BY MOUTH 3 TIMES DAILY AS NEEDED  . atorvastatin (LIPITOR) 40 MG tablet Take 1 tablet (40 mg total) by mouth at bedtime.  Marland Kitchen buPROPion (WELLBUTRIN SR) 150 MG 12 hr tablet TAKE 1 TABLET BY MOUTH TWICE A DAY  . cetirizine (ZYRTEC) 10 MG tablet Take 10 mg by mouth daily as needed.   . fluticasone (FLONASE) 50 MCG/ACT nasal spray Place 2 sprays into both nostrils daily as needed.   . hydrochlorothiazide (HYDRODIURIL) 25 MG tablet TAKE 1 TABLET BY MOUTH ONCE A DAY  . meloxicam (MOBIC) 15 MG tablet TAKE 1 TABLET BY MOUTH ONCE A DAY  . metoprolol tartrate (LOPRESSOR) 100 MG tablet Take 1 tablet (100  mg total) by mouth 2 (two) times daily.  . traMADol (ULTRAM) 50 MG tablet Take 1 tablet (50 mg total) by mouth every 6 (six) hours as needed.  . [DISCONTINUED] amitriptyline (ELAVIL) 10 MG tablet TAKE 1 TO 2 TABLETS BY MOUTH 30 MINUTES BEFORE BED  . [DISCONTINUED] fenofibrate (TRICOR) 48 MG tablet TAKE 1 TABLET BY MOUTH ONCE DAILY  . amitriptyline (ELAVIL) 25 MG tablet Take 1 tablet (25 mg total) by mouth at bedtime.  . fenofibrate 160 MG tablet Take 1 tablet (160 mg total) by mouth daily.   No facility-administered encounter medications on file as of 12/23/2020.

## 2021-01-16 ENCOUNTER — Encounter: Payer: Self-pay | Admitting: Cardiology

## 2021-01-16 ENCOUNTER — Other Ambulatory Visit: Payer: Self-pay

## 2021-01-16 ENCOUNTER — Ambulatory Visit (INDEPENDENT_AMBULATORY_CARE_PROVIDER_SITE_OTHER): Payer: Medicare Other | Admitting: Cardiology

## 2021-01-16 VITALS — BP 136/70 | HR 56 | Ht 64.5 in | Wt 206.0 lb

## 2021-01-16 DIAGNOSIS — E782 Mixed hyperlipidemia: Secondary | ICD-10-CM

## 2021-01-16 DIAGNOSIS — R0600 Dyspnea, unspecified: Secondary | ICD-10-CM | POA: Diagnosis not present

## 2021-01-16 DIAGNOSIS — Z6834 Body mass index (BMI) 34.0-34.9, adult: Secondary | ICD-10-CM

## 2021-01-16 DIAGNOSIS — R0609 Other forms of dyspnea: Secondary | ICD-10-CM

## 2021-01-16 DIAGNOSIS — I10 Essential (primary) hypertension: Secondary | ICD-10-CM | POA: Diagnosis not present

## 2021-01-16 NOTE — Progress Notes (Signed)
Cardiology Office Note:    Date:  01/16/2021   ID:  Jennifer Holt, DOB July 11, 1961, MRN [id number]  PCP:  Owens Loffler, MD   [place]  Cardiologist:  Kate Sable, MD  Advanced Practice Provider:  No care team member to display Electrophysiologist:  None       Referring MD: Owens Loffler, MD   Chief Complaint  Patient presents with  . New Patient (Initial Visit)    Referred by PCP for tachycardia and DOE. Meds reviewed verbally with patient.     History of Present Illness:    Jennifer Holt is a 60 y.o. female with a hx of hypertension, hyperlipidemia, depression who presents due to dyspnea on exertion.  Patient states having worsening symptoms of dyspnea on exertion over the past 3 to 4 weeks.  Denies chest pain, denies smoking.  Recent cholesterol labs showed elevated triglycerides level.  She was started on fenofibrate about a week ago with improvement in symptoms of shortness of breath.  Echo 08/2017 showed normal systolic function, EF 50 to 55%.  Past Medical History:  Diagnosis Date  . Allergic rhinitis due to pollen   . Blood transfusion without reported diagnosis   . FIGO stage II endometrial cancer [facility])    [facility]. + malignant cells in peritoneal fluid.   Marland Kitchen GERD (gastroesophageal reflux disease)   . Hyperlipidemia   . Hypertension   . Major depressive disorder, recurrent episode with anxious distress ([place])   . Osteoarthritis, multiple sites   . Panic attacks   . Personal history of radiation therapy 2015   uterine ca    Past Surgical History:  Procedure Laterality Date  . ABDOMINAL HYSTERECTOMY    . TONSILLECTOMY    . TONSILLECTOMY AND ADENOIDECTOMY    . TOTAL ABDOMINAL HYSTERECTOMY W/ BILATERAL SALPINGOOPHORECTOMY     BSO    Current Medications: Current Meds  Medication Sig  . ALPRAZolam (XANAX) 0.5 MG tablet TAKE 1 TABLET BY MOUTH 3 TIMES DAILY AS NEEDED  . amitriptyline (ELAVIL) 25 MG tablet Take 1  tablet (25 mg total) by mouth at bedtime.  Marland Kitchen atorvastatin (LIPITOR) 40 MG tablet Take 1 tablet (40 mg total) by mouth at bedtime.  Marland Kitchen buPROPion (WELLBUTRIN SR) 150 MG 12 hr tablet TAKE 1 TABLET BY MOUTH TWICE A DAY  . cetirizine (ZYRTEC) 10 MG tablet Take 10 mg by mouth daily as needed.   . fenofibrate 160 MG tablet Take 1 tablet (160 mg total) by mouth daily.  . fluticasone (FLONASE) 50 MCG/ACT nasal spray Place 2 sprays into both nostrils daily as needed.   . hydrochlorothiazide (HYDRODIURIL) 25 MG tablet TAKE 1 TABLET BY MOUTH ONCE A DAY  . meloxicam (MOBIC) 15 MG tablet TAKE 1 TABLET BY MOUTH ONCE A DAY  . metoprolol tartrate (LOPRESSOR) 100 MG tablet Take 1 tablet (100 mg total) by mouth 2 (two) times daily.  . traMADol (ULTRAM) 50 MG tablet Take 1 tablet (50 mg total) by mouth every 6 (six) hours as needed.     Allergies:   Ace inhibitors and Prednisone   Social History   Socioeconomic History  . Marital status: Divorced    Spouse name: Not on file  . Number of children: Not on file  . Years of education: Not on file  . Highest education level: Not on file  Occupational History  . Not on file  Tobacco Use  . Smoking status: Never Smoker  . Smokeless tobacco: Never Used  Vaping Use  . Vaping Use: Never used  Substance and Sexual Activity  . Alcohol use: Yes    Comment: occassionally  . Drug use: No  . Sexual activity: Yes    Birth control/protection: Surgical  Other Topics Concern  . Not on file  Social History Narrative  . Not on file   Social Determinants of Health   Financial Resource Strain: Low Risk   . Difficulty of Paying Living Expenses: Not hard at all  Food Insecurity: No Food Insecurity  . Worried About Charity fundraiser in the Last Year: Never true  . Ran Out of Food in the Last Year: Never true  Transportation Needs: No Transportation Needs  . Lack of Transportation (Medical): No  . Lack of Transportation (Non-Medical): No  Physical Activity:  Inactive  . Days of Exercise per Week: 0 days  . Minutes of Exercise per Session: 0 min  Stress: Stress Concern Present  . Feeling of Stress : To some extent  Social Connections: Not on file     Family History: The patient's family history includes Arthritis in her mother; Breast cancer in her paternal aunt and sister; Cancer in her paternal aunt; Heart disease in her father; Hyperlipidemia in her father and mother; Hypertension in her father, maternal grandmother, and mother; Mental illness in her brother, brother, and mother; Stroke in her maternal grandmother and mother.  ROS:   Please see the history of present illness.     All other systems reviewed and are negative.  EKGs/Labs/Other Studies Reviewed:    The following studies were reviewed today:   EKG:  EKG is  ordered today.  The ekg ordered today demonstrates sinus bradycardia, heart rate 56  Recent Labs: 12/16/2020: ALT 18; BUN 32; Creatinine, Ser 1.09; Hemoglobin 12.5; Platelets 300.0; Potassium 3.8; Sodium 141; TSH 3.46  Recent Lipid Panel    Component Value Date/Time   CHOL 195 12/16/2020 1100   TRIG 234.0 (H) 12/16/2020 1100   HDL 56.00 12/16/2020 1100   CHOLHDL 3 12/16/2020 1100   VLDL 46.8 (H) 12/16/2020 1100   LDLDIRECT 102.0 12/16/2020 1100     Risk Assessment/Calculations:      Physical Exam:    VS:  BP 136/70 (BP Location: Left Arm, Patient Position: Sitting, Cuff Size: Large)   Pulse (!) 56   Ht 5' 4.5" (1.638 m)   Wt 206 lb (93.4 kg)   BMI 34.81 kg/m     Wt Readings from Last 3 Encounters:  01/16/21 206 lb (93.4 kg)  12/23/20 204 lb 8 oz (92.8 kg)  11/06/19 208 lb 4 oz (94.5 kg)     GEN:  Well nourished, well developed in no acute distress HEENT: Normal NECK: No JVD; No carotid bruits LYMPHATICS: No lymphadenopathy CARDIAC: RRR, no murmurs, rubs, gallops RESPIRATORY:  Clear to auscultation without rales, wheezing or rhonchi  ABDOMEN: Soft, non-tender, non-distended MUSCULOSKELETAL:  No  edema; No deformity  SKIN: Warm and dry NEUROLOGIC:  Alert and oriented x 3 PSYCHIATRIC:  Normal affect   ASSESSMENT:    1. Dyspnea on exertion   2. Mixed hyperlipidemia   3. BMI 34.0-34.9,adult   4. Primary hypertension    PLAN:    In order of problems listed above:  1. Dyspnea on exertion, overall improved since starting fenofibrate.  Get echocardiogram to evaluate systolic and diastolic function.  Does not appear to be an anginal equivalent.  If symptoms get worse, will consider additional testing. 2. Hypertriglyceridemia, continue Lipitor, agree with fenofibrate.  Repeat fasting lipid profile in 6 weeks. 3. Obesity, weight loss, low-calorie diet advised. 4. Hypertension, BP controlled.  Continue HCTZ, Lopressor.  Follow-up after echocardiogram.   Medication Adjustments/Labs and Tests Ordered: Current medicines are reviewed at length with the patient today.  Concerns regarding medicines are outlined above.  Orders Placed This Encounter  Procedures  . Lipid panel  . EKG 12-Lead  . ECHOCARDIOGRAM COMPLETE   No orders of the defined types were placed in this encounter.   Patient Instructions  Medication Instructions:  Your physician recommends that you continue on your current medications as directed. Please refer to the Current Medication list given to you today.  *If you need a refill on your cardiac medications before your next appointment, please call your pharmacy*   Lab Work:  Your physician recommends that you return for a FASTING lipid profile: in 6 weeks  - You will need to be fasting. Please do not have anything to eat or drink after midnight the morning you have the lab work. You may only have water or black coffee with no cream or sugar. - Please go to the [facility]. You will check in at the front desk to the right as you walk into the atrium. Valet Parking is offered if needed. - No appointment needed. You may go any day between 7 am and 6  pm.    Testing/Procedures:  Your physician has requested that you have an echocardiogram. Echocardiography is a painless test that uses sound waves to create images of your heart. It provides your doctor with information about the size and shape of your heart and how well your heart's chambers and valves are working. This procedure takes approximately one hour. There are no restrictions for this procedure.    Follow-Up: At [facility], you and your health needs are our priority.  As part of our continuing mission to provide you with exceptional heart care, we have created designated Provider Care Teams.  These Care Teams include your primary Cardiologist (physician) and Advanced Practice Providers (APPs -  Physician Assistants and Nurse Practitioners) who all work together to provide you with the care you need, when you need it.  We recommend signing up for the patient portal called "MyChart".  Sign up information is provided on this After Visit Summary.  MyChart is used to connect with patients for Virtual Visits (Telemedicine).  Patients are able to view lab/test results, encounter notes, upcoming appointments, etc.  Non-urgent messages can be sent to your provider as well.   To learn more about what you can do with MyChart, go to [email or web address].    Your next appointment:   6 week(s)  The format for your next appointment:   In Person  Provider:   Kate Sable, MD   Other Instructions      Signed, Kate Sable, MD  01/16/2021 2:31 PM    [place]

## 2021-01-16 NOTE — Patient Instructions (Signed)
Medication Instructions:  Your physician recommends that you continue on your current medications as directed. Please refer to the Current Medication list given to you today.  *If you need a refill on your cardiac medications before your next appointment, please call your pharmacy*   Lab Work:  Your physician recommends that you return for a FASTING lipid profile: in 6 weeks  - You will need to be fasting. Please do not have anything to eat or drink after midnight the morning you have the lab work. You may only have water or black coffee with no cream or sugar. - Please go to the [facility]. You will check in at the front desk to the right as you walk into the atrium. Valet Parking is offered if needed. - No appointment needed. You may go any day between 7 am and 6 pm.    Testing/Procedures:  Your physician has requested that you have an echocardiogram. Echocardiography is a painless test that uses sound waves to create images of your heart. It provides your doctor with information about the size and shape of your heart and how well your heart's chambers and valves are working. This procedure takes approximately one hour. There are no restrictions for this procedure.    Follow-Up: At [facility], you and your health needs are our priority.  As part of our continuing mission to provide you with exceptional heart care, we have created designated Provider Care Teams.  These Care Teams include your primary Cardiologist (physician) and Advanced Practice Providers (APPs -  Physician Assistants and Nurse Practitioners) who all work together to provide you with the care you need, when you need it.  We recommend signing up for the patient portal called "MyChart".  Sign up information is provided on this After Visit Summary.  MyChart is used to connect with patients for Virtual Visits (Telemedicine).  Patients are able to view lab/test results, encounter notes, upcoming appointments, etc.   Non-urgent messages can be sent to your provider as well.   To learn more about what you can do with MyChart, go to [email or web address].    Your next appointment:   6 week(s)  The format for your next appointment:   In Person  Provider:   Kate Sable, MD   Other Instructions

## 2021-01-29 ENCOUNTER — Other Ambulatory Visit: Payer: Self-pay | Admitting: *Deleted

## 2021-01-29 MED ORDER — MELOXICAM 15 MG PO TABS
1.0000 | ORAL_TABLET | Freq: Every day | ORAL | 1 refills | Status: DC
Start: 2021-01-29 — End: 2021-08-25

## 2021-01-29 NOTE — Telephone Encounter (Signed)
Last office visit 12/23/2020 for CPE.  Last refilled 11/21/2020 for #90 with no refills.  No future appointments.

## 2021-02-18 ENCOUNTER — Other Ambulatory Visit
Admission: RE | Admit: 2021-02-18 | Discharge: 2021-02-18 | Disposition: A | Discharge status: Home / Self Care | Payer: Medicare Other | Attending: Cardiology | Admitting: Cardiology

## 2021-02-18 DIAGNOSIS — E782 Mixed hyperlipidemia: Secondary | ICD-10-CM | POA: Diagnosis not present

## 2021-02-18 LAB — LIPID PANEL
Cholesterol: 189 mg/dL (ref 0–200)
HDL: 61 mg/dL (ref >40)
LDL Cholesterol: 99 mg/dL (ref 0–99)
Total CHOL/HDL Ratio: 3.1 RATIO
Triglycerides: 145 mg/dL (ref <150)
VLDL: 29 mg/dL (ref 0–40)

## 2021-02-20 ENCOUNTER — Ambulatory Visit (INDEPENDENT_AMBULATORY_CARE_PROVIDER_SITE_OTHER): Payer: Medicare Other

## 2021-02-20 ENCOUNTER — Other Ambulatory Visit: Payer: Self-pay

## 2021-02-20 DIAGNOSIS — R0600 Dyspnea, unspecified: Secondary | ICD-10-CM | POA: Diagnosis not present

## 2021-02-20 DIAGNOSIS — R0609 Other forms of dyspnea: Secondary | ICD-10-CM

## 2021-02-20 LAB — ECHOCARDIOGRAM COMPLETE
AR max vel: 1.98 cm2
AV Area VTI: 1.93 cm2
AV Area mean vel: 2.04 cm2
AV Mean grad: 3 mmHg
AV Peak grad: 5.4 mmHg
Ao pk vel: 1.16 m/s
Area-P 1/2: 3.7 cm2
Calc EF: 49.5 %
P 1/2 time: 612 msec
S' Lateral: 3.8 cm
Single Plane A2C EF: 52 %
Single Plane A4C EF: 48.7 %

## 2021-03-04 ENCOUNTER — Other Ambulatory Visit: Payer: Self-pay

## 2021-03-04 ENCOUNTER — Encounter: Payer: Self-pay | Admitting: Cardiology

## 2021-03-04 ENCOUNTER — Ambulatory Visit (INDEPENDENT_AMBULATORY_CARE_PROVIDER_SITE_OTHER): Payer: Medicare Other | Admitting: Cardiology

## 2021-03-04 VITALS — BP 138/78 | HR 64 | Ht 64.5 in | Wt 205.0 lb

## 2021-03-04 DIAGNOSIS — R0609 Other forms of dyspnea: Secondary | ICD-10-CM

## 2021-03-04 DIAGNOSIS — I10 Essential (primary) hypertension: Secondary | ICD-10-CM

## 2021-03-04 DIAGNOSIS — R0600 Dyspnea, unspecified: Secondary | ICD-10-CM | POA: Diagnosis not present

## 2021-03-04 DIAGNOSIS — E782 Mixed hyperlipidemia: Secondary | ICD-10-CM

## 2021-03-04 NOTE — Progress Notes (Signed)
Cardiology Office Note:    Date:  03/04/2021   ID:  Jennifer Holt, DOB May 12, 1961, MRN [id number]  PCP:  Owens Loffler, MD   [place]  Cardiologist:  Kate Sable, MD  Advanced Practice Provider:  No care team member to display Electrophysiologist:  None       Referring MD: Owens Loffler, MD   Chief Complaint  Patient presents with  . Other    Follow up post ECHO. Meds reviewed verbally with patient.     History of Present Illness:    Jennifer Holt is a 60 y.o. female with a hx of hypertension, hyperlipidemia, depression who presents for follow-up.  She was last seen due to elevated triglycerides and dyspnea on exertion over a 4-week period.  Fenofibrate was started with improvement in her shortness of breath, echocardiogram obtained to evaluate any changes in cardiac function.  Repeat fasting lipid profile was also obtained.  Patient overall feels much better.  Trying to walk and lose some weight.  Prior notes Echo 08/2017 showed normal systolic function, EF 50 to 55%.  Past Medical History:  Diagnosis Date  . Allergic rhinitis due to pollen   . Blood transfusion without reported diagnosis   . FIGO stage II endometrial cancer [facility])    [facility]. + malignant cells in peritoneal fluid.   Marland Kitchen GERD (gastroesophageal reflux disease)   . Hyperlipidemia   . Hypertension   . Major depressive disorder, recurrent episode with anxious distress ([place])   . Osteoarthritis, multiple sites   . Panic attacks   . Personal history of radiation therapy 2015   uterine ca    Past Surgical History:  Procedure Laterality Date  . ABDOMINAL HYSTERECTOMY    . TONSILLECTOMY    . TONSILLECTOMY AND ADENOIDECTOMY    . TOTAL ABDOMINAL HYSTERECTOMY W/ BILATERAL SALPINGOOPHORECTOMY     BSO    Current Medications: Current Meds  Medication Sig  . ALPRAZolam (XANAX) 0.5 MG tablet TAKE 1 TABLET BY MOUTH 3 TIMES DAILY AS NEEDED  . amitriptyline (ELAVIL)  25 MG tablet Take 1 tablet (25 mg total) by mouth at bedtime.  Marland Kitchen atorvastatin (LIPITOR) 40 MG tablet Take 1 tablet (40 mg total) by mouth at bedtime.  Marland Kitchen buPROPion (WELLBUTRIN SR) 150 MG 12 hr tablet TAKE 1 TABLET BY MOUTH TWICE A DAY  . cetirizine (ZYRTEC) 10 MG tablet Take 10 mg by mouth daily as needed.   . fenofibrate 160 MG tablet Take 1 tablet (160 mg total) by mouth daily.  . fluticasone (FLONASE) 50 MCG/ACT nasal spray Place 2 sprays into both nostrils daily as needed.   . hydrochlorothiazide (HYDRODIURIL) 25 MG tablet TAKE 1 TABLET BY MOUTH ONCE A DAY  . meloxicam (MOBIC) 15 MG tablet Take 1 tablet (15 mg total) by mouth daily.  . metoprolol tartrate (LOPRESSOR) 100 MG tablet Take 1 tablet (100 mg total) by mouth 2 (two) times daily.  . traMADol (ULTRAM) 50 MG tablet Take 1 tablet (50 mg total) by mouth every 6 (six) hours as needed.     Allergies:   Ace inhibitors and Prednisone   Social History   Socioeconomic History  . Marital status: Divorced    Spouse name: Not on file  . Number of children: Not on file  . Years of education: Not on file  . Highest education level: Not on file  Occupational History  . Not on file  Tobacco Use  . Smoking status: Never Smoker  . Smokeless  tobacco: Never Used  Vaping Use  . Vaping Use: Never used  Substance and Sexual Activity  . Alcohol use: Yes    Comment: occassionally  . Drug use: No  . Sexual activity: Yes    Birth control/protection: Surgical  Other Topics Concern  . Not on file  Social History Narrative  . Not on file   Social Determinants of Health   Financial Resource Strain: Low Risk   . Difficulty of Paying Living Expenses: Not hard at all  Food Insecurity: No Food Insecurity  . Worried About Charity fundraiser in the Last Year: Never true  . Ran Out of Food in the Last Year: Never true  Transportation Needs: No Transportation Needs  . Lack of Transportation (Medical): No  . Lack of Transportation (Non-Medical):  No  Physical Activity: Inactive  . Days of Exercise per Week: 0 days  . Minutes of Exercise per Session: 0 min  Stress: Stress Concern Present  . Feeling of Stress : To some extent  Social Connections: Not on file     Family History: The patient's family history includes Arthritis in her mother; Breast cancer in her paternal aunt and sister; Cancer in her paternal aunt; Heart disease in her father; Hyperlipidemia in her father and mother; Hypertension in her father, maternal grandmother, and mother; Mental illness in her brother, brother, and mother; Stroke in her maternal grandmother and mother.  ROS:   Please see the history of present illness.     All other systems reviewed and are negative.  EKGs/Labs/Other Studies Reviewed:    The following studies were reviewed today:   EKG:  EKG not ordered today.  Recent Labs: 12/16/2020: ALT 18; BUN 32; Creatinine, Ser 1.09; Hemoglobin 12.5; Platelets 300.0; Potassium 3.8; Sodium 141; TSH 3.46  Recent Lipid Panel    Component Value Date/Time   CHOL 189 02/18/2021 1123   TRIG 145 02/18/2021 1123   HDL 61 02/18/2021 1123   CHOLHDL 3.1 02/18/2021 1123   VLDL 29 02/18/2021 1123   LDLCALC 99 02/18/2021 1123   LDLDIRECT 102.0 12/16/2020 1100     Risk Assessment/Calculations:      Physical Exam:    VS:  BP 138/78 (BP Location: Left Arm, Patient Position: Sitting, Cuff Size: Normal)   Pulse 64   Ht 5' 4.5" (1.638 m)   Wt 205 lb (93 kg)   SpO2 96%   BMI 34.64 kg/m     Wt Readings from Last 3 Encounters:  03/04/21 205 lb (93 kg)  01/16/21 206 lb (93.4 kg)  12/23/20 204 lb 8 oz (92.8 kg)     GEN:  Well nourished, well developed in no acute distress HEENT: Normal NECK: No JVD; No carotid bruits LYMPHATICS: No lymphadenopathy CARDIAC: RRR, no murmurs, rubs, gallops RESPIRATORY:  Clear to auscultation without rales, wheezing or rhonchi  ABDOMEN: Soft, non-tender, non-distended MUSCULOSKELETAL:  No edema; No deformity  SKIN:  Warm and dry NEUROLOGIC:  Alert and oriented x 3 PSYCHIATRIC:  Normal affect   ASSESSMENT:    1. Dyspnea on exertion   2. Mixed hyperlipidemia   3. Primary hypertension    PLAN:    In order of problems listed above:  1. Dyspnea on exertion, overall improved since starting fenofibrate.  Echocardiogram showed preserved EF, 50%.  Deconditioning was likely etiology.  Graduated exercising, weight loss advised. 2. Hypertriglyceridemia, continue Lipitor, fenofibrate.  Triglycerides levels now controlled. 3. Hypertension, BP controlled.  Continue HCTZ, Lopressor.  Follow-up in 6 months  Medication Adjustments/Labs and Tests Ordered: Current medicines are reviewed at length with the patient today.  Concerns regarding medicines are outlined above.  No orders of the defined types were placed in this encounter.  No orders of the defined types were placed in this encounter.   Patient Instructions  Medication Instructions:  Your physician recommends that you continue on your current medications as directed. Please refer to the Current Medication list given to you today.  *If you need a refill on your cardiac medications before your next appointment, please call your pharmacy*   Lab Work: None ordered If you have labs (blood work) drawn today and your tests are completely normal, you will receive your results only by: Marland Kitchen MyChart Message (if you have MyChart) OR . A paper copy in the mail If you have any lab test that is abnormal or we need to change your treatment, we will call you to review the results.   Testing/Procedures: None ordered   Follow-Up: At [facility], you and your health needs are our priority.  As part of our continuing mission to provide you with exceptional heart care, we have created designated Provider Care Teams.  These Care Teams include your primary Cardiologist (physician) and Advanced Practice Providers (APPs -  Physician Assistants and Nurse  Practitioners) who all work together to provide you with the care you need, when you need it.  We recommend signing up for the patient portal called "MyChart".  Sign up information is provided on this After Visit Summary.  MyChart is used to connect with patients for Virtual Visits (Telemedicine).  Patients are able to view lab/test results, encounter notes, upcoming appointments, etc.  Non-urgent messages can be sent to your provider as well.   To learn more about what you can do with MyChart, go to [email or web address].    Your next appointment:   Your physician wants you to follow-up in: 6 months You will receive a reminder letter in the mail two months in advance. If you don't receive a letter, please call our office to schedule the follow-up appointment.   The format for your next appointment:   In Person  Provider:   You may see Kate Sable, MD or one of the following Advanced Practice Providers on your designated Care Team:    Murray Hodgkins, NP  Christell Faith, PA-C  Marrianne Mood, PA-C  Cadence [place], [place]  Laurann Montana, NP    Other Instructions N/A     Signed, Kate Sable, MD  03/04/2021 9:45 AM    [place]

## 2021-03-04 NOTE — Patient Instructions (Signed)
Medication Instructions:  Your physician recommends that you continue on your current medications as directed. Please refer to the Current Medication list given to you today.  *If you need a refill on your cardiac medications before your next appointment, please call your pharmacy*   Lab Work: None ordered If you have labs (blood work) drawn today and your tests are completely normal, you will receive your results only by: Marland Kitchen MyChart Message (if you have MyChart) OR . A paper copy in the mail If you have any lab test that is abnormal or we need to change your treatment, we will call you to review the results.   Testing/Procedures: None ordered   Follow-Up: At [facility], you and your health needs are our priority.  As part of our continuing mission to provide you with exceptional heart care, we have created designated Provider Care Teams.  These Care Teams include your primary Cardiologist (physician) and Advanced Practice Providers (APPs -  Physician Assistants and Nurse Practitioners) who all work together to provide you with the care you need, when you need it.  We recommend signing up for the patient portal called "MyChart".  Sign up information is provided on this After Visit Summary.  MyChart is used to connect with patients for Virtual Visits (Telemedicine).  Patients are able to view lab/test results, encounter notes, upcoming appointments, etc.  Non-urgent messages can be sent to your provider as well.   To learn more about what you can do with MyChart, go to [email or web address].    Your next appointment:   Your physician wants you to follow-up in: 6 months You will receive a reminder letter in the mail two months in advance. If you don't receive a letter, please call our office to schedule the follow-up appointment.   The format for your next appointment:   In Person  Provider:   You may see Kate Sable, MD or one of the following Advanced Practice Providers on  your designated Care Team:    Murray Hodgkins, NP  Christell Faith, PA-C  Marrianne Mood, PA-C  Cadence [place], [place]  Laurann Montana, NP    Other Instructions N/A

## 2021-03-05 ENCOUNTER — Other Ambulatory Visit: Payer: Self-pay | Admitting: Family Medicine

## 2021-03-05 DIAGNOSIS — F410 Panic disorder [episodic paroxysmal anxiety] without agoraphobia: Secondary | ICD-10-CM

## 2021-03-05 DIAGNOSIS — F339 Major depressive disorder, recurrent, unspecified: Secondary | ICD-10-CM

## 2021-03-10 ENCOUNTER — Other Ambulatory Visit: Payer: Self-pay | Admitting: Family Medicine

## 2021-03-10 ENCOUNTER — Other Ambulatory Visit: Payer: Self-pay | Admitting: *Deleted

## 2021-03-10 ENCOUNTER — Other Ambulatory Visit: Payer: Self-pay

## 2021-03-10 MED ORDER — HYDROCHLOROTHIAZIDE 25 MG PO TABS: 1.0000 | ORAL_TABLET | Freq: Every day | ORAL | 3 refills | Status: DC

## 2021-03-10 MED ORDER — METOPROLOL TARTRATE 100 MG PO TABS: 100.0000 mg | ORAL_TABLET | Freq: Two times a day (BID) | ORAL | 0 refills | Status: DC

## 2021-03-10 NOTE — Telephone Encounter (Signed)
Louisa left a voicemail stating that the patient is wanting to transfer her scripts back to their pharmacy. They are requesting a refill on patient's HCTZ be sent to them.

## 2021-04-18 ENCOUNTER — Other Ambulatory Visit: Payer: Self-pay | Admitting: Family Medicine

## 2021-04-18 NOTE — Telephone Encounter (Signed)
Last office visit 12/23/2020 for CPE.  Last refilled 09/27/2020 for #90 with 3 refills.  No future appointments with PCP.

## 2021-05-26 ENCOUNTER — Other Ambulatory Visit: Payer: Self-pay | Admitting: Family Medicine

## 2021-05-26 NOTE — Telephone Encounter (Signed)
Last office visit 12/23/2020 for CPE.   Last refilled 05/24/2020 for #60 with 3 refills.  No future appointments with PCP.

## 2021-06-05 ENCOUNTER — Other Ambulatory Visit: Payer: Self-pay | Admitting: Family Medicine

## 2021-08-23 ENCOUNTER — Telehealth: Payer: Self-pay | Admitting: Family Medicine

## 2021-08-24 NOTE — Telephone Encounter (Signed)
Last office visit 12/23/20 for CPE.  Last refilled 01/29/21 for #90 with 1 refill. No future appointments with PCP.   Please schedule CPE with fasting labs prior with Dr. Lorelei Pont sometime after 12/01/21.  Already has [place] scheduled with nurse.

## 2021-08-25 ENCOUNTER — Other Ambulatory Visit: Payer: Self-pay | Admitting: *Deleted

## 2021-08-25 ENCOUNTER — Encounter: Payer: Self-pay | Admitting: Family Medicine

## 2021-08-25 NOTE — Telephone Encounter (Signed)
Last office visit 12/23/20 for CPE.  Last refilled 04/20/21 for #90 with 3 refill.  CPE scheduled on 12/29/2021.

## 2021-08-25 NOTE — Telephone Encounter (Signed)
Pt called back and scheduled for 12/29/21

## 2021-08-25 NOTE — Telephone Encounter (Signed)
Called patient and left vm to call and schedule CPE

## 2021-08-26 MED ORDER — ALPRAZOLAM 0.5 MG PO TABS: 0.5000 mg | ORAL_TABLET | Freq: Three times a day (TID) | ORAL | 3 refills | Status: DC | PRN

## 2021-10-06 ENCOUNTER — Other Ambulatory Visit: Payer: Self-pay | Admitting: Family Medicine

## 2021-11-28 NOTE — Progress Notes (Signed)
Subjective:   Jennifer Holt is a 61 y.o. female who presents for Medicare Annual (Subsequent) preventive examination.  I connected with Miki Kins today by telephone and verified that I am speaking with the correct person using two identifiers. Location patient: home Location provider: work Persons participating in the virtual visit: patient, Marine scientist.    I discussed the limitations, risks, security and privacy concerns of performing an evaluation and management service by telephone and the availability of in person appointments. I also discussed with the patient that there may be a patient responsible charge related to this service. The patient expressed understanding and verbally consented to this telephonic visit.    Interactive audio and video telecommunications were attempted between this provider and patient, however failed, due to patient having technical difficulties OR patient did not have access to video capability.  We continued and completed visit with audio only.  Some vital signs may be absent or patient reported.   Time Spent with patient on telephone encounter: 25 minutes  Review of Systems     Cardiac Risk Factors include: advanced age (>55men, >59 women);hypertension     Objective:    Today's Vitals   12/01/21 1032  Weight: 205 lb (93 kg)  Height: 5\' 4"  (1.626 m)   Body mass index is 35.19 kg/m.  Advanced Directives 12/01/2021 11/29/2020 10/31/2019 10/26/2018 10/19/2018 04/20/2018 09/08/2017  Does Patient Have a Medical Advance Directive? No No No No No No No  Does patient want to make changes to medical advance directive? - No - Patient declined - - - - -  Would patient like information on creating a medical advance directive? Yes (MAU/Ambulatory/Procedural Areas - Information given) - No - Patient declined No - Patient declined No - Patient declined No - Patient declined No - Patient declined    Current Medications (verified) Outpatient Encounter Medications  as of 12/01/2021  Medication Sig   ALPRAZolam (XANAX) 0.5 MG tablet Take 1 tablet (0.5 mg total) by mouth 3 (three) times daily as needed.   amitriptyline (ELAVIL) 25 MG tablet TAKE 1 TABLET BY MOUTH EVERY NIGHT AT BEDTIME   atorvastatin (LIPITOR) 40 MG tablet TAKE 1 TABLET BY MOUTH EVERY NIGHT AT BEDTIME   buPROPion (WELLBUTRIN SR) 150 MG 12 hr tablet TAKE 1 TABLET BY MOUTH TWICE A DAY   fenofibrate 160 MG tablet Take 1 tablet (160 mg total) by mouth daily.   fluticasone (FLONASE) 50 MCG/ACT nasal spray Place 2 sprays into both nostrils daily as needed.    hydrochlorothiazide (HYDRODIURIL) 25 MG tablet Take 1 tablet (25 mg total) by mouth daily.   meloxicam (MOBIC) 15 MG tablet TAKE 1 TABLET BY MOUTH ONCE A DAY   metoprolol tartrate (LOPRESSOR) 100 MG tablet TAKE 1 TABLET BY MOUTH TWICE A DAY   cetirizine (ZYRTEC) 10 MG tablet Take 10 mg by mouth daily as needed.  (Patient not taking: Reported on 12/01/2021)   traMADol (ULTRAM) 50 MG tablet TAKE 1 TABLET BY MOUTH EVERY 6 HOURS AS NEEDED (Patient not taking: Reported on 12/01/2021)   No facility-administered encounter medications on file as of 12/01/2021.    Allergies (verified) Ace inhibitors and Prednisone   History: Past Medical History:  Diagnosis Date   Allergic rhinitis due to pollen    Blood transfusion without reported diagnosis    FIGO stage II endometrial cancer [facility])    [facility]. + malignant cells in peritoneal fluid.    GERD (gastroesophageal reflux disease)    Hyperlipidemia  Hypertension    Major depressive disorder, recurrent episode with anxious distress (HCC)    Osteoarthritis, multiple sites    Panic attacks    Personal history of radiation therapy 2015   uterine ca   Past Surgical History:  Procedure Laterality Date   ABDOMINAL HYSTERECTOMY     TONSILLECTOMY     TONSILLECTOMY AND ADENOIDECTOMY     TOTAL ABDOMINAL HYSTERECTOMY W/ BILATERAL SALPINGOOPHORECTOMY     BSO   Family History  Problem Relation Age  of Onset   Arthritis Mother    Hyperlipidemia Mother    Stroke Mother    Hypertension Mother    Mental illness Mother    Hyperlipidemia Father    Heart disease Father    Hypertension Father    Mental illness Brother    Stroke Maternal Grandmother    Hypertension Maternal Grandmother    Mental illness Brother    Breast cancer Sister    Breast cancer Paternal Aunt    Cancer Paternal Aunt    Social History   Socioeconomic History   Marital status: Divorced    Spouse name: Not on file   Number of children: Not on file   Years of education: Not on file   Highest education level: Not on file  Occupational History   Not on file  Tobacco Use   Smoking status: Never   Smokeless tobacco: Never  Vaping Use   Vaping Use: Never used  Substance and Sexual Activity   Alcohol use: Yes    Comment: occassionally   Drug use: No   Sexual activity: Yes    Birth control/protection: Surgical  Other Topics Concern   Not on file  Social History Narrative   Not on file   Social Determinants of Health   Financial Resource Strain: Low Risk    Difficulty of Paying Living Expenses: Not hard at all  Food Insecurity: No Food Insecurity   Worried About Charity fundraiser in the Last Year: Never true   [address] in the Last Year: Never true  Transportation Needs: No Transportation Needs   Lack of Transportation (Medical): No   Lack of Transportation (Non-Medical): No  Physical Activity: Unknown   Days of Exercise per Week: 2 days   Minutes of Exercise per Session: Not on file  Stress: No Stress Concern Present   Feeling of Stress : Not at all  Social Connections: Socially Isolated   Frequency of Communication with Friends and Family: More than three times a week   Frequency of Social Gatherings with Friends and Family: More than three times a week   Attends Religious Services: Never   Marine scientist or Organizations: No   Attends Music therapist: Never    Marital Status: Divorced    Tobacco Counseling Counseling given: Not Answered   Clinical Intake:  Pre-visit preparation completed: Yes  Pain : No/denies pain     BMI - recorded: 35.19 Nutritional Status: BMI > 30  Obese Nutritional Risks: None Diabetes: No  How often do you need to have someone help you when you read instructions, pamphlets, or other written materials from your doctor or pharmacy?: 1 - Never  Diabetic? No  Interpreter Needed?: No  Information entered by :: Orrin Brigham LPN   Activities of Daily Living In your present state of health, do you have any difficulty performing the following activities: 12/01/2021  Hearing? N  Vision? N  Difficulty concentrating or making decisions? N  Walking  or climbing stairs? Y  Comment when knee are hurting, uses hand rails  Dressing or bathing? N  Doing errands, shopping? N  Preparing Food and eating ? N  Using the Toilet? N  In the past six months, have you accidently leaked urine? N  Do you have problems with loss of bowel control? N  Managing your Medications? N  Managing your Finances? N  Housekeeping or managing your Housekeeping? N  Some recent data might be hidden    Patient Care Team: Owens Loffler, MD as PCP - General (Family Medicine) Kate Sable, MD as PCP - Cardiology (Cardiology)  Indicate any recent Medical Services you may have received from other than [facility] providers in the past year (date may be approximate).     Assessment:   This is a routine wellness examination for Jennifer Holt.  Hearing/Vision screen Hearing Screening - Comments:: No issues  Vision Screening - Comments:: Last exam 06/2021, wears glasses, Dr. Lyndel Safe in [facility]   Dietary issues and exercise activities discussed: Current Exercise Habits: Home exercise routine, Type of exercise: walking, Frequency (Times/Week): 2, Intensity: Mild, Exercise limited by: orthopedic condition(s) (knees hurt)   Goals Addressed              This Visit's Progress    Patient Stated       Would like to eat healthier and start walking        Depression Screen PHQ 2/9 Scores 12/01/2021 11/29/2020 10/31/2019 10/26/2018 04/25/2018 12/21/2016  PHQ - 2 Score 0 0 4 4 6 6   PHQ- 9 Score - 0 4 14 16 23     Fall Risk Fall Risk  12/01/2021 11/29/2020 10/31/2019 10/26/2018  Falls in the past year? 0 0 0 0  Number falls in past yr: 0 0 0 -  Injury with Fall? 0 0 0 -  Risk for fall due to : No Fall Risks Medication side effect Medication side effect -  Follow up Falls prevention discussed Falls evaluation completed;Falls prevention discussed Falls evaluation completed;Falls prevention discussed -    FALL RISK PREVENTION PERTAINING TO THE HOME:  Any stairs in or around the home? Yes  If so, are there any without handrails? No  Home free of loose throw rugs in walkways, pet beds, electrical cords, etc? Yes  Adequate lighting in your home to reduce risk of falls? Yes   ASSISTIVE DEVICES UTILIZED TO PREVENT FALLS:  Life alert? No  Use of a cane, walker or w/c? No  Grab bars in the bathroom? No  Shower chair or bench in shower? Yes  Elevated toilet seat or a handicapped toilet? No   TIMED UP AND GO:  Was the test performed? No .   Cognitive Function: Normal cognitive status assessed by this Nurse Health Advisor. No abnormalities found.   MMSE - Mini Mental State Exam 11/29/2020 10/31/2019 10/26/2018  Orientation to time 5 5 5   Orientation to Place 5 5 5   Registration 3 3 3   Attention/ Calculation 5 5 0  Recall 3 3 3   Language- name 2 objects - - 0  Language- repeat 1 1 1   Language- follow 3 step command - - 3  Language- read & follow direction - - 0  Write a sentence - - 0  Copy design - - 0  Total score - - 20        Immunizations  There is no immunization history on file for this patient.  TDAP status: Due, Education has been provided regarding the importance  of this vaccine. Advised may receive this vaccine at local  pharmacy or Health Dept. Aware to provide a copy of the vaccination record if obtained from local pharmacy or Health Dept. Verbalized acceptance and understanding.  Flu Vaccine status: Declined, Education has been provided regarding the importance of this vaccine but patient still declined. Advised may receive this vaccine at local pharmacy or Health Dept. Aware to provide a copy of the vaccination record if obtained from local pharmacy or Health Dept. Verbalized acceptance and understanding.  Pneumococcal vaccine status: Patient not yet eligible   Covid-19 vaccine status: Declined, Education has been provided regarding the importance of this vaccine but patient still declined. Advised may receive this vaccine at local pharmacy or Health Dept.or vaccine clinic. Aware to provide a copy of the vaccination record if obtained from local pharmacy or Health Dept. Verbalized acceptance and understanding.  Qualifies for Shingles Vaccine? Yes   Zostavax completed No   Shingrix Completed?: No.    Education has been provided regarding the importance of this vaccine. Patient has been advised to call insurance company to determine out of pocket expense if they have not yet received this vaccine. Advised may also receive vaccine at local pharmacy or Health Dept. Verbalized acceptance and understanding.  Screening Tests Health Maintenance  Topic Date Due   Zoster Vaccines- Shingrix (1 of 2) Never done   COLONOSCOPY (Pts 45-54yrs Insurance coverage will need to be confirmed)  Never done   MAMMOGRAM  07/07/2020   INFLUENZA VACCINE  Never done   COVID-19 Vaccine (1) 12/15/2021 (Originally 08/17/1961)   TETANUS/TDAP  11/30/2023 (Originally 02/13/1980)   Hepatitis C Screening  Completed   HIV Screening  Completed   HPV VACCINES  Aged Out   PAP SMEAR-Modifier  Discontinued    Health Maintenance  Health Maintenance Due  Topic Date Due   Zoster Vaccines- Shingrix (1 of 2) Never done   COLONOSCOPY (Pts  45-50yrs Insurance coverage will need to be confirmed)  Never done   MAMMOGRAM  07/07/2020   INFLUENZA VACCINE  Never done    Colorectal cancer screening: Patient plans on discussing with PCP  Mammogram status: Ordered 12/01/21. Pt provided with contact info and advised to call to schedule appt.   Bone Density status: Patient not yet eligible   Lung Cancer Screening: (Low Dose CT Chest recommended if Age 26-80 years, 30 pack-year currently smoking OR have quit w/in 15years.) does not qualify.     Additional Screening:  Hepatitis C Screening: does qualify; Completed 10/26/18  Vision Screening: Recommended annual ophthalmology exams for early detection of glaucoma and other disorders of the eye. Is the patient up to date with their annual eye exam?  Yes  Who is the provider or what is the name of the office in which the patient attends annual eye exams? Dr. Lyndel Safe in [place]: Recommended annual dental exams for proper oral hygiene  Community Resource Referral / Chronic Care Management: CRR required this visit?  No   CCM required this visit?  No      Plan:     I have personally reviewed and noted the following in the patients chart:   Medical and social history Use of alcohol, tobacco or illicit drugs  Current medications and supplements including opioid prescriptions.  Functional ability and status Nutritional status Physical activity Advanced directives List of other physicians Hospitalizations, surgeries, and ER visits in previous 12 months Vitals Screenings to include cognitive, depression, and falls Referrals and appointments  In addition, I have reviewed and discussed with patient certain preventive protocols, quality metrics, and best practice recommendations. A written personalized care plan for preventive services as well as general preventive health recommendations were provided to patient.   Due to this being a telephonic visit, the  after visit summary with patients personalized plan was offered to patient via mail or my-chart.  Patient would like to access on my-chart.     Loma Messing, LPN   [id number]   Nurse Health Advisor  Nurse Notes: none

## 2021-12-01 ENCOUNTER — Other Ambulatory Visit: Payer: Self-pay

## 2021-12-01 ENCOUNTER — Ambulatory Visit (INDEPENDENT_AMBULATORY_CARE_PROVIDER_SITE_OTHER): Payer: Medicare Other

## 2021-12-01 VITALS — Ht 64.0 in | Wt 205.0 lb

## 2021-12-01 DIAGNOSIS — Z0000 Encounter for general adult medical examination without abnormal findings: Secondary | ICD-10-CM

## 2021-12-01 DIAGNOSIS — Z1231 Encounter for screening mammogram for malignant neoplasm of breast: Secondary | ICD-10-CM | POA: Diagnosis not present

## 2021-12-01 NOTE — Patient Instructions (Signed)
Jennifer Holt , Thank you for taking time to complete  your Medicare Wellness Visit. I appreciate your ongoing commitment to your health goals. Please review the following plan we discussed and let me know if I can assist you in the future.   Screening recommendations/referrals: Colonoscopy: due, per our conversation you plan on discussing with PCP Mammogram: due, last completed 07/07/18, scheduled today, someone will call to schedule an appointment Bone Density: not yet eligible, starts at age 40+ Recommended yearly ophthalmology/optometry visit for glaucoma screening and checkup Recommended yearly dental visit for hygiene and checkup  Vaccinations: Influenza vaccine: due, discuss with PCP if you change your mind Pneumococcal vaccine: Prevnar 13 and Pneumovax 21 start age age 78+ Tdap vaccine: due, discuss with PCP if you change your mind Shingles vaccine: Discuss with pharmacy if you change your mind  Covid-19: newest booster available at your local pharmacy   Advanced directives: information available at your next appointment  Conditions/risks identified: see problem list  Next appointment: Follow up in one year for your annual wellness visit. 12/03/22 @ 9:00am, this will be a telephone visit.   Preventive Care 40-64 Years, Female Preventive care refers to lifestyle choices and visits with your health care provider that can promote health and wellness. What does preventive care include? A yearly physical exam. This is also called an annual well check. Dental exams once or twice a year. Routine eye exams. Ask your health care provider how often you should have your eyes checked. Personal lifestyle choices, including: Daily care of your teeth and gums. Regular physical activity. Eating a healthy diet. Avoiding tobacco and drug use. Limiting alcohol use. Practicing safe sex. Taking low-dose aspirin daily starting at age 62. Taking vitamin and mineral supplements as recommended by  your health care provider. What happens during an annual well check? The services and screenings done by your health care provider during your annual well check will depend on your age, overall health, lifestyle risk factors, and family history of disease. Counseling  Your health care provider may ask you questions about your: Alcohol use. Tobacco use. Drug use. Emotional well-being. Home and relationship well-being. Sexual activity. Eating habits. Work and work Statistician. Method of birth control. Menstrual cycle. Pregnancy history. Screening  You may have the following tests or measurements: Height, weight, and BMI. Blood pressure. Lipid and cholesterol levels. These may be checked every 5 years, or more frequently if you are over 42 years old. Skin check. Lung cancer screening. You may have this screening every year starting at age 33 if you have a 30-pack-year history of smoking and currently smoke or have quit within the past 15 years. Fecal occult blood test (FOBT) of the stool. You may have this test every year starting at age 22. Flexible sigmoidoscopy or colonoscopy. You may have a sigmoidoscopy every 5 years or a colonoscopy every 10 years starting at age 72. Hepatitis C blood test. Hepatitis B blood test. Sexually transmitted disease (STD) testing. Diabetes screening. This is done by checking your blood sugar (glucose) after you have not eaten for a while (fasting). You may have this done every 1-3 years. Mammogram. This may be done every 1-2 years. Talk to your health care provider about when you should start having regular mammograms. This may depend on whether you have a family history of breast cancer. BRCA-related cancer screening. This may be done if you have a family history of breast, ovarian, tubal, or peritoneal cancers. Pelvic exam and Pap test. This may be  done every 3 years starting at age 74. Starting at age 77, this may be done every 5 years if you have a Pap  test in combination with an HPV test. Bone density scan. This is done to screen for osteoporosis. You may have this scan if you are at high risk for osteoporosis. Discuss your test results, treatment options, and if necessary, the need for more tests with your health care provider. Vaccines  Your health care provider may recommend certain vaccines, such as: Influenza vaccine. This is recommended every year. Tetanus, diphtheria, and acellular pertussis (Tdap, Td) vaccine. You may need a Td booster every 10 years. Zoster vaccine. You may need this after age 68. Pneumococcal 13-valent conjugate (PCV13) vaccine. You may need this if you have certain conditions and were not previously vaccinated. Pneumococcal polysaccharide (PPSV23) vaccine. You may need one or two doses if you smoke cigarettes or if you have certain conditions. Talk to your health care provider about which screenings and vaccines you need and how often you need them. This information is not intended to replace advice given to you by your health care provider. Make sure you discuss any questions you have with your health care provider. Document Released: 10/11/2015 Document Revised: 06/03/2016 Document Reviewed: 07/16/2015 Elsevier Interactive Patient Education  2017 [place] Prevention in the Home Falls can cause injuries. They can happen to people of all ages. There are many things you can do to make your home safe and to help prevent falls. What can I do on the outside of my home? Regularly fix the edges of walkways and driveways and fix any cracks. Remove anything that might make you trip as you walk through a door, such as a raised step or threshold. Trim any bushes or trees on the path to your home. Use bright outdoor lighting. Clear any walking paths of anything that might make someone trip, such as rocks or tools. Regularly check to see if handrails are loose or broken. Make sure that both sides of any steps  have handrails. Any raised decks and porches should have guardrails on the edges. Have any leaves, snow, or ice cleared regularly. Use sand or salt on walking paths during winter. Clean up any spills in your garage right away. This includes oil or grease spills. What can I do in the bathroom? Use night lights. Install grab bars by the toilet and in the tub and shower. Do not use towel bars as grab bars. Use non-skid mats or decals in the tub or shower. If you need to sit down in the shower, use a plastic, non-slip stool. Keep the floor dry. Clean up any water that spills on the floor as soon as it happens. Remove soap buildup in the tub or shower regularly. Attach bath mats securely with double-sided non-slip rug tape. Do not have throw rugs and other things on the floor that can make you trip. What can I do in the bedroom? Use night lights. Make sure that you have a light by your bed that is easy to reach. Do not use any sheets or blankets that are too big for your bed. They should not hang down onto the floor. Have a firm chair that has side arms. You can use this for support while you get dressed. Do not have throw rugs and other things on the floor that can make you trip. What can I do in the kitchen? Clean up any spills right away. Avoid walking  on wet floors. Keep items that you use a lot in easy-to-reach places. If you need to reach something above you, use a strong step stool that has a grab bar. Keep electrical cords out of the way. Do not use floor polish or wax that makes floors slippery. If you must use wax, use non-skid floor wax. Do not have throw rugs and other things on the floor that can make you trip. What can I do with my stairs? Do not leave any items on the stairs. Make sure that there are handrails on both sides of the stairs and use them. Fix handrails that are broken or loose. Make sure that handrails are as long as the stairways. Check any carpeting to make  sure that it is firmly attached to the stairs. Fix any carpet that is loose or worn. Avoid having throw rugs at the top or bottom of the stairs. If you do have throw rugs, attach them to the floor with carpet tape. Make sure that you have a light switch at the top of the stairs and the bottom of the stairs. If you do not have them, ask someone to add them for you. What else can I do to help prevent falls? Wear shoes that: Do not have high heels. Have rubber bottoms. Are comfortable and fit you well. Are closed at the toe. Do not wear sandals. If you use a stepladder: Make sure that it is fully opened. Do not climb a closed stepladder. Make sure that both sides of the stepladder are locked into place. Ask someone to hold it for you, if possible. Clearly mark and make sure that you can see: Any grab bars or handrails. First and last steps. Where the edge of each step is. Use tools that help you move around (mobility aids) if they are needed. These include: Canes. Walkers. Scooters. Crutches. Turn on the lights when you go into a dark area. Replace any light bulbs as soon as they burn out. Set up your furniture so you have a clear path. Avoid moving your furniture around. If any of your floors are uneven, fix them. If there are any pets around you, be aware of where they are. Review your medicines with your doctor. Some medicines can make you feel dizzy. This can increase your chance of falling. Ask your doctor what other things that you can do to help prevent falls. This information is not intended to replace advice given to you by your health care provider. Make sure you discuss any questions you have with your health care provider. Document Released: 07/11/2009 Document Revised: 02/20/2016 Document Reviewed: 10/19/2014 Elsevier Interactive Patient Education  2017 [employer].

## 2021-12-03 ENCOUNTER — Other Ambulatory Visit: Payer: Self-pay | Admitting: Family Medicine

## 2021-12-03 DIAGNOSIS — E781 Pure hyperglyceridemia: Secondary | ICD-10-CM

## 2021-12-22 ENCOUNTER — Other Ambulatory Visit (INDEPENDENT_AMBULATORY_CARE_PROVIDER_SITE_OTHER): Payer: Medicare Other

## 2021-12-22 ENCOUNTER — Other Ambulatory Visit: Payer: Self-pay

## 2021-12-22 DIAGNOSIS — E559 Vitamin D deficiency, unspecified: Secondary | ICD-10-CM | POA: Diagnosis not present

## 2021-12-22 DIAGNOSIS — Z79899 Other long term (current) drug therapy: Secondary | ICD-10-CM | POA: Diagnosis not present

## 2021-12-22 DIAGNOSIS — E785 Hyperlipidemia, unspecified: Secondary | ICD-10-CM | POA: Diagnosis not present

## 2021-12-22 DIAGNOSIS — R7309 Other abnormal glucose: Secondary | ICD-10-CM | POA: Diagnosis not present

## 2021-12-22 DIAGNOSIS — Z8542 Personal history of malignant neoplasm of other parts of uterus: Secondary | ICD-10-CM

## 2021-12-22 DIAGNOSIS — E039 Hypothyroidism, unspecified: Secondary | ICD-10-CM | POA: Diagnosis not present

## 2021-12-22 LAB — BASIC METABOLIC PANEL
BUN: 33 mg/dL — ABNORMAL HIGH (ref 6–23)
CO2: 32 mEq/L (ref 19–32)
Calcium: 10 mg/dL (ref 8.4–10.5)
Chloride: 100 mEq/L (ref 96–112)
Creatinine, Ser: 1.43 mg/dL — ABNORMAL HIGH (ref 0.40–1.20)
GFR: 39.21 mL/min — ABNORMAL LOW (ref >60.00)
Glucose, Bld: 115 mg/dL — ABNORMAL HIGH (ref 70–99)
Potassium: 4.2 mEq/L (ref 3.5–5.1)
Sodium: 138 mEq/L (ref 135–145)

## 2021-12-22 LAB — HEPATIC FUNCTION PANEL
ALT: 16 U/L (ref 0–35)
AST: 19 U/L (ref 0–37)
Albumin: 4.8 g/dL (ref 3.5–5.2)
Alkaline Phosphatase: 52 U/L (ref 39–117)
Bilirubin, Direct: 0.1 mg/dL (ref 0.0–0.3)
Total Bilirubin: 0.6 mg/dL (ref 0.2–1.2)
Total Protein: 7.3 g/dL (ref 6.0–8.3)

## 2021-12-22 LAB — CBC WITH DIFFERENTIAL/PLATELET
Basophils Absolute: 0 10*3/uL (ref 0.0–0.1)
Basophils Relative: 0.9 % (ref 0.0–3.0)
Eosinophils Absolute: 0.1 10*3/uL (ref 0.0–0.7)
Eosinophils Relative: 2.5 % (ref 0.0–5.0)
HCT: 36.4 % (ref 36.0–46.0)
Hemoglobin: 12.3 g/dL (ref 12.0–15.0)
Lymphocytes Relative: 28.3 % (ref 12.0–46.0)
Lymphs Abs: 1.3 10*3/uL (ref 0.7–4.0)
MCHC: 33.6 g/dL (ref 30.0–36.0)
MCV: 92.6 fl (ref 78.0–100.0)
Monocytes Absolute: 0.4 10*3/uL (ref 0.1–1.0)
Monocytes Relative: 7.5 % (ref 3.0–12.0)
Neutro Abs: 2.9 10*3/uL (ref 1.4–7.7)
Neutrophils Relative %: 60.8 % (ref 43.0–77.0)
Platelets: 283 10*3/uL (ref 150.0–400.0)
RBC: 3.93 Mil/uL (ref 3.87–5.11)
RDW: 12.7 % (ref 11.5–15.5)
WBC: 4.8 10*3/uL (ref 4.0–10.5)

## 2021-12-22 LAB — LIPID PANEL
Cholesterol: 186 mg/dL (ref 0–200)
HDL: 60.5 mg/dL (ref >39.00)
NonHDL: 125.16
Total CHOL/HDL Ratio: 3
Triglycerides: 223 mg/dL — ABNORMAL HIGH (ref 0.0–149.0)
VLDL: 44.6 mg/dL — ABNORMAL HIGH (ref 0.0–40.0)

## 2021-12-22 LAB — LDL CHOLESTEROL, DIRECT: Direct LDL: 106 mg/dL

## 2021-12-22 LAB — HEMOGLOBIN A1C: Hgb A1c MFr Bld: 6.1 % (ref 4.6–6.5)

## 2021-12-23 LAB — T4, FREE: Free T4: 0.78 ng/dL (ref 0.60–1.60)

## 2021-12-23 LAB — CA 125: CA 125: 4 U/mL (ref <35)

## 2021-12-23 LAB — T3, FREE: T3, Free: 2.9 pg/mL (ref 2.3–4.2)

## 2021-12-23 LAB — VITAMIN D 25 HYDROXY (VIT D DEFICIENCY, FRACTURES): VITD: 58.92 ng/mL (ref 30.00–100.00)

## 2021-12-23 LAB — TSH: TSH: 4.82 u[IU]/mL (ref 0.35–5.50)

## 2021-12-29 ENCOUNTER — Other Ambulatory Visit: Payer: Self-pay

## 2021-12-29 ENCOUNTER — Encounter: Payer: Self-pay | Admitting: Family Medicine

## 2021-12-29 ENCOUNTER — Ambulatory Visit (INDEPENDENT_AMBULATORY_CARE_PROVIDER_SITE_OTHER): Payer: Medicare Other | Admitting: Family Medicine

## 2021-12-29 VITALS — BP 144/84 | HR 69 | Temp 98.6°F | Ht 64.5 in | Wt 213.2 lb

## 2021-12-29 DIAGNOSIS — M159 Polyosteoarthritis, unspecified: Secondary | ICD-10-CM

## 2021-12-29 DIAGNOSIS — F410 Panic disorder [episodic paroxysmal anxiety] without agoraphobia: Secondary | ICD-10-CM | POA: Diagnosis not present

## 2021-12-29 DIAGNOSIS — F339 Major depressive disorder, recurrent, unspecified: Secondary | ICD-10-CM | POA: Diagnosis not present

## 2021-12-29 DIAGNOSIS — C541 Malignant neoplasm of endometrium: Secondary | ICD-10-CM

## 2021-12-29 DIAGNOSIS — Z1211 Encounter for screening for malignant neoplasm of colon: Secondary | ICD-10-CM | POA: Diagnosis not present

## 2021-12-29 DIAGNOSIS — N183 Chronic kidney disease, stage 3 unspecified: Secondary | ICD-10-CM | POA: Insufficient documentation

## 2021-12-29 DIAGNOSIS — N1831 Chronic kidney disease, stage 3a: Secondary | ICD-10-CM

## 2021-12-29 DIAGNOSIS — I10 Essential (primary) hypertension: Secondary | ICD-10-CM

## 2021-12-29 DIAGNOSIS — E7800 Pure hypercholesterolemia, unspecified: Secondary | ICD-10-CM

## 2021-12-29 HISTORY — DX: Chronic kidney disease, stage 3 unspecified: N18.30

## 2021-12-29 MED ORDER — NA SULFATE-K SULFATE-MG SULF 17.5-3.13-1.6 GM/177ML PO SOLN: 1.0000 | Freq: Once | ORAL | 0 refills | Status: AC

## 2021-12-29 MED ORDER — TRAMADOL HCL 50 MG PO TABS: 50.0000 mg | ORAL_TABLET | Freq: Four times a day (QID) | ORAL | 0 refills | Status: DC | PRN

## 2021-12-29 MED ORDER — AMLODIPINE BESYLATE 5 MG PO TABS: 5.0000 mg | ORAL_TABLET | Freq: Every day | ORAL | 1 refills | Status: DC

## 2021-12-29 NOTE — Patient Instructions (Addendum)
Consider Shingrix vaccine (Shingles vaccine) ?- you can get at any pharmacy ?

## 2021-12-29 NOTE — Progress Notes (Signed)
? ? ?Surie Suchocki T. Mylen Mangan, MD, [facility] ?Therapist, music at [facility] ?[place] ?[place], [address] ? ?Phone: [phone]  FAX: [phone] ? ?Jennifer Holt - 61 y.o. female  MRN [id number]  Date of Birth: 1961-09-09 ? ?Date: 12/29/2021  PCP: Owens Loffler, MD  Referral: Owens Loffler, MD ? ?Chief Complaint  ?Patient presents with  ?? Annual Exam  ?  Part 2  ? ? ?This visit occurred during the SARS-CoV-2 public health emergency.  Safety protocols were in place, including screening questions prior to the visit, additional usage of staff PPE, and extensive cleaning of exam room while observing appropriate contact time as indicated for disinfecting solutions.  ? ?Subjective:  ? ?Jennifer Holt is a 61 y.o. very pleasant female patient with Body mass index is 36.03 kg/m?. who presents with the following: ? ?She is here for follow-up on multiple medical problems.  She has already had her medical wellness exam for Medicare. ? ?She does have stage II endometrial cancer.  She was lost to follow-up.  She was released in 2020.  She did have a gynecological exam in 2019 at [facility] but her gynecologist left the practice.  She has not had additional gynecology or GYN/Onc follow-up since 2019. ? ?Declines several vaccines. ?COVID-vaccine - she declines ?Shingrix -  ?Colonoscopy ? ?Recommended mammogram. ? ? ?This has been her worst year.  Having a lot more pain now in her knees and back.  Cannot do things that she cannot really do. ?- Back and knees are hurting.   ?Both of her knees and back hurt.  ? ?HTN: Tolerating all medications without side effects ?Stable and at goal ?No CP, no sob. No HA. ? ?BP Readings from Last 3 Encounters:  ?12/29/21 (!) 144/84  ?03/04/21 138/78  ?01/16/21 136/70  ? ? ?Basic Metabolic Panel: ?   ?Component Value Date/Time  ? NA 138 12/22/2021 0907  ? NA 137 11/07/2013 1615  ? K 4.2 12/22/2021 0907  ? K 4.1 11/07/2013 1615  ? CL 100 12/22/2021  0907  ? CL 105 11/07/2013 1615  ? CO2 32 12/22/2021 0907  ? CO2 28 11/07/2013 1615  ? BUN 33 (H) 12/22/2021 6629  ? BUN 18 11/07/2013 1615  ? CREATININE 1.43 (H) 12/22/2021 4765  ? CREATININE 0.92 08/15/2014 0925  ? GLUCOSE 115 (H) 12/22/2021 4650  ? GLUCOSE 95 11/07/2013 1615  ? CALCIUM 10.0 12/22/2021 0907  ? CALCIUM 9.7 11/07/2013 1615  ?  ?Lipids: Doing well, stable. Tolerating meds fine with no SE. ?Panel reviewed with patient. ? ?Lipids: ?Lab Results  ?Component Value Date  ? CHOL 186 12/22/2021  ? ?Lab Results  ?Component Value Date  ? HDL 60.50 12/22/2021  ? ?Lab Results  ?Component Value Date  ? [place] 99 02/18/2021  ? ?Lab Results  ?Component Value Date  ? TRIG 223.0 (H) 12/22/2021  ? ?Lab Results  ?Component Value Date  ? CHOLHDL 3 12/22/2021  ? ? ?Lab Results  ?Component Value Date  ? ALT 16 12/22/2021  ? AST 19 12/22/2021  ? ALKPHOS 52 12/22/2021  ? BILITOT 0.6 12/22/2021  ?  ?Chronic kidney disease stage III.  This will need to be monitored long-term. ? ?Review of Systems is noted in the HPI, as appropriate ? ?Objective:  ? ?BP (!) 144/84   Pulse 69   Temp 98.6 ?F (37 ?C) (Oral)   Ht 5' 4.5" (1.638 m)   Wt 213 lb 3 oz (96.7 kg)  SpO2 97%   BMI 36.03 kg/m?  ? ?GEN: No acute distress; alert,appropriate. ?PULM: Breathing comfortably in no respiratory distress ?PSYCH: Normally interactive.  ?CV: RRR, no m/g/r  ?PULM: Normal respiratory rate, no accessory muscle use. No wheezes, crackles or rhonchi  ?ABD: S, NT, ND, + BS, No rebound, No HSM  ? ?Laboratory and Imaging Data: ?Results for orders placed or performed in visit on 12/22/21  ?Lipid panel  ?Result Value Ref Range  ? Cholesterol 186 0 - 200 mg/dL  ? Triglycerides 223.0 (H) 0.0 - 149.0 mg/dL  ? HDL 60.50 >39.00 mg/dL  ? VLDL 44.6 (H) 0.0 - 40.0 mg/dL  ? Total CHOL/HDL Ratio 3   ? NonHDL 125.16   ?Hepatic function panel  ?Result Value Ref Range  ? Total Bilirubin 0.6 0.2 - 1.2 mg/dL  ? Bilirubin, Direct 0.1 0.0 - 0.3 mg/dL  ? Alkaline  Phosphatase 52 39 - 117 U/L  ? AST 19 0 - 37 U/L  ? ALT 16 0 - 35 U/L  ? Total Protein 7.3 6.0 - 8.3 g/dL  ? Albumin 4.8 3.5 - 5.2 g/dL  ?Basic metabolic panel  ?Result Value Ref Range  ? Sodium 138 135 - 145 mEq/L  ? Potassium 4.2 3.5 - 5.1 mEq/L  ? Chloride 100 96 - 112 mEq/L  ? CO2 32 19 - 32 mEq/L  ? Glucose, Bld 115 (H) 70 - 99 mg/dL  ? BUN 33 (H) 6 - 23 mg/dL  ? Creatinine, Ser 1.43 (H) 0.40 - 1.20 mg/dL  ? GFR 39.21 (L) >60.00 mL/min  ? Calcium 10.0 8.4 - 10.5 mg/dL  ?T3, free  ?Result Value Ref Range  ? T3, Free 2.9 2.3 - 4.2 pg/mL  ?T4, free  ?Result Value Ref Range  ? Free T4 0.78 0.60 - 1.60 ng/dL  ?TSH  ?Result Value Ref Range  ? TSH 4.82 0.35 - 5.50 uIU/mL  ?VITAMIN D 25 Hydroxy (Vit-D Deficiency, Fractures)  ?Result Value Ref Range  ? VITD 58.92 30.00 - 100.00 ng/mL  ?CBC with Differential/Platelet  ?Result Value Ref Range  ? WBC 4.8 4.0 - 10.5 K/uL  ? RBC 3.93 3.87 - 5.11 Mil/uL  ? Hemoglobin 12.3 12.0 - 15.0 g/dL  ? HCT 36.4 36.0 - 46.0 %  ? MCV 92.6 78.0 - 100.0 fl  ? MCHC 33.6 30.0 - 36.0 g/dL  ? RDW 12.7 11.5 - 15.5 %  ? Platelets 283.0 150.0 - 400.0 K/uL  ? Neutrophils Relative % 60.8 43.0 - 77.0 %  ? Lymphocytes Relative 28.3 12.0 - 46.0 %  ? Monocytes Relative 7.5 3.0 - 12.0 %  ? Eosinophils Relative 2.5 0.0 - 5.0 %  ? Basophils Relative 0.9 0.0 - 3.0 %  ? Neutro Abs 2.9 1.4 - 7.7 K/uL  ? Lymphs Abs 1.3 0.7 - 4.0 K/uL  ? Monocytes Absolute 0.4 0.1 - 1.0 K/uL  ? Eosinophils Absolute 0.1 0.0 - 0.7 K/uL  ? Basophils Absolute 0.0 0.0 - 0.1 K/uL  ?CA 125  ?Result Value Ref Range  ? CA 125 4 <35 U/mL  ?Hemoglobin A1c  ?Result Value Ref Range  ? Hgb A1c MFr Bld 6.1 4.6 - 6.5 %  ?LDL cholesterol, direct  ?Result Value Ref Range  ? Direct LDL 106.0 mg/dL  ?  ? ?Assessment and Plan:  ? ?  ICD-10-CM   ?1. Essential hypertension  I10   ?  ?2. Pure hypercholesterolemia  E78.00   ?  ?3. Major depressive disorder, recurrent episode with anxious  distress (HCC)  F33.9   ?  ?4. Panic attacks  F41.0   ?  ?5. FIGO  stage II endometrial cancer ([place])  C54.1 Ambulatory referral to Gynecologic Oncology  ?  ?6. Screening for malignant neoplasm of colon  Z12.11 Ambulatory referral to Gastroenterology  ?  ?7. Stage 3a chronic kidney disease ([place])  N18.31   ?  ?8. Primary osteoarthritis involving multiple joints  M15.9   ?  ? ?Elevated hypertension.  Add Norvasc.  Close follow-up.  Multiple medications already. ? ?CKD, will need ongoing chronic follow-up. ? ?Chronic back pain chronic joint pain.  Refill her tramadol. ? ?Chronic cholesterol, continue Lipitor. ? ?Depression and anxiety.  Continue Wellbutrin.  She is also on Xanax chronically and has been on this from many years.   ? ?Stage II endometrial cancer.  CA125 is negative today.  She was lost to follow-up.  No gynecological care since 2019.  This needs to be followed in some way shape or form.  I do not know how to do this, and I am going to involve GYN/Onc.  I will defer to them how they would like to have this monitored long-term whether it is through them or long-term gynecology. ? ?Meds ordered this encounter  ?Medications  ?? traMADol (ULTRAM) 50 MG tablet  ?  Sig: Take 1 tablet (50 mg total) by mouth every 6 (six) hours as needed.  ?  Dispense:  60 tablet  ?  Refill:  0  ?  She needs a follow-up physical or general follow-up  ?? amLODipine (NORVASC) 5 MG tablet  ?  Sig: Take 1 tablet (5 mg total) by mouth daily.  ?  Dispense:  90 tablet  ?  Refill:  1  ? ?Medications Discontinued During This Encounter  ?Medication Reason  ?? traMADol (ULTRAM) 50 MG tablet Reorder  ? ?Orders Placed This Encounter  ?Procedures  ?? Ambulatory referral to Gynecologic Oncology  ?? Ambulatory referral to Gastroenterology  ? ? ?Follow-up: No follow-ups on file. ? ?Dragon Medical One speech-to-text software was used for transcription in this dictation.  Possible transcriptional errors can occur using Editor, commissioning.  ? ?Signed, ? ?Kamiryn Bezanson T. Isela Stantz, MD ? ? ?Outpatient Encounter Medications as  of 12/29/2021  ?Medication Sig  ?? ALPRAZolam (XANAX) 0.5 MG tablet Take 1 tablet (0.5 mg total) by mouth 3 (three) times daily as needed.  ?? amitriptyline (ELAVIL) 25 MG tablet TAKE 1 TABLET BY MOUTH EVERY NIGHT AT BEDTIME

## 2021-12-29 NOTE — Progress Notes (Signed)
Gastroenterology Pre-Procedure Review ? ?Request Date: 02/11/2022 ?Requesting Physician: Dr. Vicente Males ? ?PATIENT REVIEW QUESTIONS: The patient responded to the following health history questions as indicated:   ? ?1. Are you having any GI issues? no ?2. Do you have a personal history of Polyps? no ?3. Do you have a family history of Colon Cancer or Polyps? yes (POLYPS) ?4. Diabetes Mellitus? no ?5. Joint replacements in the past 12 months?no ?6. Major health problems in the past 3 months?no ?7. Any artificial heart valves, MVP, or defibrillator?no ?   ?MEDICATIONS & ALLERGIES:    ?Patient reports the following regarding taking any anticoagulation/antiplatelet therapy:   ?Plavix, Coumadin, Eliquis, Xarelto, Lovenox, Pradaxa, Brilinta, or Effient? no ?Aspirin? no ? ?Patient confirms/reports the following medications:  ?Current Outpatient Medications  ?Medication Sig Dispense Refill  ? ALPRAZolam (XANAX) 0.5 MG tablet Take 1 tablet (0.5 mg total) by mouth 3 (three) times daily as needed. 90 tablet 3  ? amitriptyline (ELAVIL) 25 MG tablet TAKE 1 TABLET BY MOUTH EVERY NIGHT AT BEDTIME 90 tablet 0  ? amLODipine (NORVASC) 5 MG tablet Take 1 tablet (5 mg total) by mouth daily. 90 tablet 1  ? atorvastatin (LIPITOR) 40 MG tablet TAKE 1 TABLET BY MOUTH EVERY NIGHT AT BEDTIME 90 tablet 3  ? buPROPion (WELLBUTRIN SR) 150 MG 12 hr tablet TAKE 1 TABLET BY MOUTH TWICE A DAY 60 tablet 5  ? cetirizine (ZYRTEC) 10 MG tablet Take 10 mg by mouth daily as needed.    ? fenofibrate 160 MG tablet TAKE 1 TABLET BY MOUTH ONCE A DAY 90 tablet 0  ? fluticasone (FLONASE) 50 MCG/ACT nasal spray Place 2 sprays into both nostrils daily as needed.     ? hydrochlorothiazide (HYDRODIURIL) 25 MG tablet Take 1 tablet (25 mg total) by mouth daily. 90 tablet 3  ? meloxicam (MOBIC) 15 MG tablet TAKE 1 TABLET BY MOUTH ONCE A DAY 90 tablet 1  ? metoprolol tartrate (LOPRESSOR) 100 MG tablet TAKE 1 TABLET BY MOUTH TWICE A DAY 180 tablet 0  ? traMADol (ULTRAM) 50  MG tablet Take 1 tablet (50 mg total) by mouth every 6 (six) hours as needed. 60 tablet 0  ? ?No current facility-administered medications for this visit.  ? ? ?Patient confirms/reports the following allergies:  ?Allergies  ?Allergen Reactions  ? Ace Inhibitors Swelling  ?  Angioedema.  ? Prednisone Other (See Comments)  ?  Flu like symptoms  ? ? ?No orders of the defined types were placed in this encounter. ? ? ?AUTHORIZATION INFORMATION ?Primary Insurance: ?1D#: ?Group #: ? ?Secondary Insurance: ?1D#: ?Group #: ? ?SCHEDULE INFORMATION: ?Date: 02/11/2022 ?Time: ?Location: [facility] ? ?

## 2021-12-30 ENCOUNTER — Telehealth: Payer: Self-pay | Admitting: Family Medicine

## 2021-12-30 ENCOUNTER — Telehealth: Payer: Self-pay | Admitting: *Deleted

## 2021-12-30 DIAGNOSIS — C541 Malignant neoplasm of endometrium: Secondary | ICD-10-CM

## 2021-12-30 DIAGNOSIS — Z01411 Encounter for gynecological examination (general) (routine) with abnormal findings: Secondary | ICD-10-CM

## 2021-12-30 NOTE — Telephone Encounter (Addendum)
[place] ? ?Oncologist reviewed the referral ? ?Since she is more than five years out (cancer 2015) she could follow-up with a regular GYN ? ? ?[facility]  [phone] ? ?If she wants cone system, she could see someone at [facility] obgy [phone] ?

## 2021-12-30 NOTE — Telephone Encounter (Signed)
Called and spoke with Amy at Dr Arlyn Dunning office regarding the referral. Explained per Dr Berline Lopes "Since the patient is more than five years out and per the cancer guidelines the patient can be followed by a regular gyn office. Dr Berline Lopes saw where the patient saw Dr Fransisca Connors in 2020 and his recommendation was to follow up with the [facility] in [place] [phone]). But if the patient wanted something in the [facility] system she can try [facility] in [place] [phone])."  ?

## 2021-12-30 NOTE — Telephone Encounter (Incomplete Revision)
[place] ? ?Oncologist reviewed the referral ? ?Since she is more than five years out (cancer 2015) she could follow-up with a regular GYN ? ? ?[facility]  [phone] ? ?If she wants cone system, she could see someone at [facility] ob[facility] [phone] ?

## 2021-12-31 NOTE — Telephone Encounter (Signed)
Can you call and relay message to gynecological oncology would rather her follow-up with her regular gynecology. ? ?Right now, the best option would be to her see her former gynecology group.  They are very limited numbers of gynecology appointments in this community, but I am sure they would be happy to see her.  Dr. Leonides Schanz who saw her recently has a left their practice and this community. ? ?Please let me know, and we would be happy to make this referral for her.  Okay? ?

## 2021-12-31 NOTE — Telephone Encounter (Signed)
ICD-10-CM   ?1. FIGO stage II endometrial cancer ([place])  C54.1 Ambulatory referral to Obstetrics / Gynecology  ?  ?2. Encounter for gynecological examination with abnormal finding  Z01.411 Ambulatory referral to Obstetrics / Gynecology  ?  ? ? ?Orders Placed This Encounter  ?Procedures  ? Ambulatory referral to Obstetrics / Gynecology  ? ?  ?

## 2021-12-31 NOTE — Telephone Encounter (Signed)
Spoke with Ivin Booty.  She would like referral to GYN.  Prefers female  MD in [place].  ?

## 2021-12-31 NOTE — Telephone Encounter (Signed)
Left message for Jennifer Holt to return my call in regards to GYN referral. ?

## 2022-01-12 ENCOUNTER — Other Ambulatory Visit: Payer: Self-pay | Admitting: Family Medicine

## 2022-01-12 DIAGNOSIS — F339 Major depressive disorder, recurrent, unspecified: Secondary | ICD-10-CM

## 2022-01-12 DIAGNOSIS — F410 Panic disorder [episodic paroxysmal anxiety] without agoraphobia: Secondary | ICD-10-CM

## 2022-01-15 ENCOUNTER — Encounter: Payer: Self-pay | Admitting: *Deleted

## 2022-02-06 ENCOUNTER — Other Ambulatory Visit: Payer: Self-pay

## 2022-02-06 ENCOUNTER — Encounter: Payer: Self-pay | Admitting: Gastroenterology

## 2022-02-11 ENCOUNTER — Ambulatory Visit: Payer: Medicare Other | Admitting: Anesthesiology

## 2022-02-11 ENCOUNTER — Encounter
Admission: RE | Disposition: A | Discharge status: Home / Self Care | Payer: Self-pay | Source: Home / Self Care | Attending: Gastroenterology

## 2022-02-11 ENCOUNTER — Encounter: Payer: Self-pay | Admitting: Gastroenterology

## 2022-02-11 ENCOUNTER — Ambulatory Visit
Admission: RE | Admit: 2022-02-11 | Discharge: 2022-02-11 | Disposition: A | Discharge status: Home / Self Care | Payer: Medicare Other | Attending: Gastroenterology | Admitting: Gastroenterology

## 2022-02-11 DIAGNOSIS — I129 Hypertensive chronic kidney disease with stage 1 through stage 4 chronic kidney disease, or unspecified chronic kidney disease: Secondary | ICD-10-CM | POA: Diagnosis not present

## 2022-02-11 DIAGNOSIS — F329 Major depressive disorder, single episode, unspecified: Secondary | ICD-10-CM | POA: Insufficient documentation

## 2022-02-11 DIAGNOSIS — E785 Hyperlipidemia, unspecified: Secondary | ICD-10-CM | POA: Diagnosis not present

## 2022-02-11 DIAGNOSIS — N183 Chronic kidney disease, stage 3 unspecified: Secondary | ICD-10-CM | POA: Diagnosis not present

## 2022-02-11 DIAGNOSIS — Z1211 Encounter for screening for malignant neoplasm of colon: Secondary | ICD-10-CM | POA: Diagnosis not present

## 2022-02-11 DIAGNOSIS — K5730 Diverticulosis of large intestine without perforation or abscess without bleeding: Secondary | ICD-10-CM | POA: Insufficient documentation

## 2022-02-11 DIAGNOSIS — Z8542 Personal history of malignant neoplasm of other parts of uterus: Secondary | ICD-10-CM | POA: Insufficient documentation

## 2022-02-11 DIAGNOSIS — Z923 Personal history of irradiation: Secondary | ICD-10-CM | POA: Insufficient documentation

## 2022-02-11 DIAGNOSIS — E7800 Pure hypercholesterolemia, unspecified: Secondary | ICD-10-CM | POA: Diagnosis not present

## 2022-02-11 DIAGNOSIS — M150 Primary generalized (osteo)arthritis: Secondary | ICD-10-CM | POA: Insufficient documentation

## 2022-02-11 HISTORY — PX: COLONOSCOPY WITH PROPOFOL: SHX5780

## 2022-02-11 SURGERY — COLONOSCOPY WITH PROPOFOL
Anesthesia: General

## 2022-02-11 MED ORDER — PROPOFOL 500 MG/50ML IV EMUL
INTRAVENOUS | Status: AC
  Filled 2022-02-11: qty 50

## 2022-02-11 MED ORDER — LIDOCAINE HCL (CARDIAC) PF 100 MG/5ML IV SOSY
PREFILLED_SYRINGE | INTRAVENOUS | Status: DC | PRN
  Administered 2022-02-11: 100 mg via INTRAVENOUS

## 2022-02-11 MED ORDER — PROPOFOL 500 MG/50ML IV EMUL
INTRAVENOUS | Status: AC
  Filled 2022-02-11: qty 650

## 2022-02-11 MED ORDER — PROPOFOL 500 MG/50ML IV EMUL
INTRAVENOUS | Status: DC | PRN
  Administered 2022-02-11: 165 ug/kg/min via INTRAVENOUS

## 2022-02-11 MED ORDER — MIDAZOLAM HCL 2 MG/2ML IJ SOLN
INTRAMUSCULAR | Status: AC
  Filled 2022-02-11: qty 2

## 2022-02-11 MED ORDER — SODIUM CHLORIDE 0.9 % IV SOLN: INTRAVENOUS | Status: DC

## 2022-02-11 MED ORDER — PROPOFOL 10 MG/ML IV BOLUS
INTRAVENOUS | Status: DC | PRN
  Administered 2022-02-11: 70 mg via INTRAVENOUS
  Administered 2022-02-11: 30 mg via INTRAVENOUS

## 2022-02-11 MED ORDER — PROPOFOL 10 MG/ML IV BOLUS
INTRAVENOUS | Status: AC
  Filled 2022-02-11: qty 20

## 2022-02-11 NOTE — Anesthesia Postprocedure Evaluation (Signed)
Anesthesia Post Note ? ?Patient: Jennifer Holt ? ?Procedure(s) Performed: COLONOSCOPY WITH PROPOFOL ? ?Patient location during evaluation: PACU ?Anesthesia Type: General ?Level of consciousness: awake and alert ?Pain management: pain level controlled ?Vital Signs Assessment: post-procedure vital signs reviewed and stable ?Respiratory status: spontaneous breathing, nonlabored ventilation, respiratory function stable and patient connected to nasal cannula oxygen ?Cardiovascular status: blood pressure returned to baseline and stable ?Postop Assessment: no apparent nausea or vomiting ?Anesthetic complications: no ? ? ?No notable events documented. ? ? ?Last Vitals:  ?Vitals:  ? 02/11/22 0810 02/11/22 0820  ?BP: 106/65 120/76  ?Pulse: (!) 58 (!) 55  ?Resp: 13 17  ?Temp:    ?SpO2: 100% 100%  ?  ?Last Pain:  ?Vitals:  ? 02/11/22 0658  ?TempSrc: Temporal  ?PainSc: 0-No pain  ? ? ?  ?  ?  ?  ?  ?  ? ?Molli Barrows ? ? ? ? ?

## 2022-02-11 NOTE — H&P (Signed)
? ? ? ?Jennifer Bellows, MD ?[address] [place], [place], [place], [address] ?[address], [place], [place], [place], [address] ?Phone: [phone]  ?Fax: [phone] ? ?Primary Care Physician:  Jennifer Loffler, MD ? ? ?Pre-Procedure History & Physical: ?HPI:  Jennifer Holt is a 61 y.o. female is here for an colonoscopy. ?  ?Past Medical History:  ?Diagnosis Date  ? Allergic rhinitis due to pollen   ? Blood transfusion without reported diagnosis   ? CKD (chronic kidney disease) stage 3, GFR 30-59 ml/min (HCC) 12/29/2021  ? FIGO stage II endometrial cancer ([place])   ? [facility]. + malignant cells in peritoneal fluid.   ? GERD (gastroesophageal reflux disease)   ? Hyperlipidemia   ? Hypertension   ? Major depressive disorder, recurrent episode with anxious distress ([place])   ? Osteoarthritis, multiple sites   ? Panic attacks   ? Personal history of radiation therapy 2015  ? uterine ca  ? ? ?Past Surgical History:  ?Procedure Laterality Date  ? ABDOMINAL HYSTERECTOMY    ? COLONOSCOPY    ? TONSILLECTOMY    ? TONSILLECTOMY AND ADENOIDECTOMY    ? TOTAL ABDOMINAL HYSTERECTOMY W/ BILATERAL SALPINGOOPHORECTOMY    ? BSO  ? ? ?Prior to Admission medications   ?Medication Sig Start Date End Date Taking? Authorizing Provider  ?ALPRAZolam (XANAX) 0.5 MG tablet Take 1 tablet (0.5 mg total) by mouth 3 (three) times daily as needed. 08/26/21  Yes Holt, Jennifer Hamman, MD  ?amitriptyline (ELAVIL) 25 MG tablet TAKE 1 TABLET BY MOUTH EVERY NIGHT AT BEDTIME 10/06/21  Yes Holt, Jennifer Hamman, MD  ?amLODipine (NORVASC) 5 MG tablet Take 1 tablet (5 mg total) by mouth daily. 12/29/21  Yes Holt, Jennifer Hamman, MD  ?atorvastatin (LIPITOR) 40 MG tablet TAKE 1 TABLET BY MOUTH EVERY NIGHT AT BEDTIME 03/10/21  Yes Holt, Jennifer Hamman, MD  ?buPROPion (WELLBUTRIN SR) 150 MG 12 hr tablet TAKE 1 TABLET BY MOUTH TWICE A DAY 01/12/22  Yes Holt, Spencer, MD  ?fenofibrate 160 MG tablet TAKE 1 TABLET BY MOUTH ONCE A DAY 12/03/21  Yes Holt, Jennifer Hamman, MD   ?hydrochlorothiazide (HYDRODIURIL) 25 MG tablet Take 1 tablet (25 mg total) by mouth daily. 03/10/21  Yes Holt, Jennifer Hamman, MD  ?meloxicam (MOBIC) 15 MG tablet TAKE 1 TABLET BY MOUTH ONCE A DAY 08/25/21  Yes Holt, Jennifer Hamman, MD  ?metoprolol tartrate (LOPRESSOR) 100 MG tablet TAKE 1 TABLET BY MOUTH TWICE A DAY 12/03/21  Yes Holt, Jennifer Hamman, MD  ?cetirizine (ZYRTEC) 10 MG tablet Take 10 mg by mouth daily as needed.    [provider]  ?fluticasone (FLONASE) 50 MCG/ACT nasal spray Place 2 sprays into both nostrils daily as needed.     [provider]  ?traMADol (ULTRAM) 50 MG tablet Take 1 tablet (50 mg total) by mouth every 6 (six) hours as needed. 12/29/21   Jennifer Loffler, MD  ? ? ?Allergies as of 12/30/2021 - Review Complete 12/29/2021  ?Allergen Reaction Noted  ? Ace inhibitors Swelling 06/25/2015  ? Prednisone Other (See Comments) 06/24/2015  ? ? ?Family History  ?Problem Relation Age of Onset  ? Arthritis Mother   ? Hyperlipidemia Mother   ? Stroke Mother   ? Hypertension Mother   ? Mental illness Mother   ? Hyperlipidemia Father   ? Heart disease Father   ? Hypertension Father   ? Mental illness Brother   ? Stroke Maternal Grandmother   ? Hypertension Maternal Grandmother   ? Mental illness Brother   ? Breast cancer  Sister   ? Breast cancer Paternal Aunt   ? Cancer Paternal Aunt   ? ? ?Social History  ? ?Socioeconomic History  ? Marital status: Divorced  ?  Spouse name: Not on file  ? Number of children: Not on file  ? Years of education: Not on file  ? Highest education level: Not on file  ?Occupational History  ? Not on file  ?Tobacco Use  ? Smoking status: Never  ? Smokeless tobacco: Never  ?Vaping Use  ? Vaping Use: Never used  ?Substance and Sexual Activity  ? Alcohol use: Yes  ?  Comment: occassionally  ? Drug use: No  ? Sexual activity: Yes  ?  Birth control/protection: Surgical  ?Other Topics Concern  ? Not on file  ?Social History Narrative  ? Not on file  ? ?Social Determinants of  Health  ? ?Financial Resource Strain: Low Risk   ? Difficulty of Paying Living Expenses: Not hard at all  ?Food Insecurity: No Food Insecurity  ? Worried About Charity fundraiser in the Last Year: Never true  ? Ran Out of Food in the Last Year: Never true  ?Transportation Needs: No Transportation Needs  ? Lack of Transportation (Medical): No  ? Lack of Transportation (Non-Medical): No  ?Physical Activity: Unknown  ? Days of Exercise per Week: 2 days  ? Minutes of Exercise per Session: Not on file  ?Stress: No Stress Concern Present  ? Feeling of Stress : Not at all  ?Social Connections: Socially Isolated  ? Frequency of Communication with Friends and Family: More than three times a week  ? Frequency of Social Gatherings with Friends and Family: More than three times a week  ? Attends Religious Services: Never  ? Active Member of Clubs or Organizations: No  ? Attends Archivist Meetings: Never  ? Marital Status: Divorced  ?Intimate Partner Violence: Not At Risk  ? Fear of Current or Ex-Partner: No  ? Emotionally Abused: No  ? Physically Abused: No  ? Sexually Abused: No  ? ? ?Review of Systems: ?See HPI, otherwise negative ROS ? ?Physical Exam: ?BP (!) 162/86   Temp (!) 96.3 ?F (35.7 ?C) (Temporal)   Ht 5' 4.5" (1.638 m)   Wt 92.7 kg   SpO2 100%   BMI 34.55 kg/m?  ?General:   Alert,  pleasant and cooperative in NAD ?Head:  Normocephalic and atraumatic. ?Neck:  Supple; no masses or thyromegaly. ?Lungs:  Clear throughout to auscultation, normal respiratory effort.    ?Heart:  +S1, +S2, Regular rate and rhythm, No edema. ?Abdomen:  Soft, nontender and nondistended. Normal bowel sounds, without guarding, and without rebound.   ?Neurologic:  Alert and  oriented x4;  grossly normal neurologically. ? ?Impression/Plan: ?Jennifer Holt is here for an colonoscopy to be performed for Screening colonoscopy average risk   ?Risks, benefits, limitations, and alternatives regarding  colonoscopy have been reviewed  with the patient.  Questions have been answered.  All parties agreeable. ? ? ?Jennifer Bellows, MD  02/11/2022, 7:37 AM ? ?

## 2022-02-11 NOTE — Transfer of Care (Signed)
Immediate Anesthesia Transfer of Care Note ? ?Patient: Jennifer Holt ? ?Procedure(s) Performed: COLONOSCOPY WITH PROPOFOL ? ?Patient Location: Endoscopy Unit ? ?Anesthesia Type:General ? ?Level of Consciousness: drowsy and patient cooperative ? ?Airway & Oxygen Therapy: Patient Spontanous Breathing and Patient connected to face mask oxygen ? ?Post-op Assessment: Report given to RN and Post -op Vital signs reviewed and stable ? ?Post vital signs: Reviewed and stable ? ?Last Vitals:  ?Vitals Value Taken Time  ?BP    ?Temp    ?Pulse    ?Resp 14 02/11/22 0808  ?SpO2    ?Vitals shown include unvalidated device data. ? ?Last Pain:  ?Vitals:  ? 02/11/22 0658  ?TempSrc: Temporal  ?PainSc: 0-No pain  ?   ? ?  ? ?Complications: No notable events documented. ?

## 2022-02-11 NOTE — Op Note (Signed)
[facility] ?Gastroenterology ?Patient Name: Jennifer Holt ?Procedure Date: 02/11/2022 7:20 AM ?MRN: [id number] ?Account #: [id number] ?Date of Birth: [birth year] ?Admit Type: Outpatient ?Age: 61 ?Room: [facility] ENDO ROOM 3 ?Gender: Female ?Note Status: Finalized ?Instrument Name: Colonoscope [id number] ?Procedure:             Colonoscopy ?Indications:           Screening for colorectal malignant neoplasm ?Providers:             [name] MD, MD ?Referring MD:          Maud Deed. Copland MD, MD (Referring MD) ?Medicines:             Monitored Anesthesia Care ?Complications:         No immediate complications. ?Procedure:             Pre-Anesthesia Assessment: ?                       - Prior to the procedure, a History and Physical was  ?                       performed, and patient medications, allergies and  ?                       sensitivities were reviewed. The patient's tolerance  ?                       of previous anesthesia was reviewed. ?                       - The risks and benefits of the procedure and the  ?                       sedation options and risks were discussed with the  ?                       patient. All questions were answered and informed  ?                       consent was obtained. ?                       - ASA Grade Assessment: II - A patient with mild  ?                       systemic disease. ?                       After obtaining informed consent, the colonoscope was  ?                       passed under direct vision. Throughout the procedure,  ?                       the patient's blood pressure, pulse, and oxygen  ?                       saturations were monitored continuously. The  ?                       Colonoscope  was introduced through the anus and  ?                       advanced to the the cecum, identified by the  ?                       appendiceal orifice. The colonoscopy was performed  ?                       with ease. The patient tolerated the procedure  well.  ?                       The quality of the bowel preparation was excellent. ?Findings: ?     The perianal and digital rectal examinations were normal. ?     Multiple small-mouthed diverticula were found in the sigmoid colon. ?     The exam was otherwise without abnormality on direct and retroflexion  ?     views. ?Impression:            - Diverticulosis in the sigmoid colon. ?                       - The examination was otherwise normal on direct and  ?                       retroflexion views. ?                       - No specimens collected. ?Recommendation:        - Discharge patient to home (with escort). ?                       - Resume previous diet. ?                       - Continue present medications. ?                       - Repeat colonoscopy in 10 years for screening  ?                       purposes. ?Procedure Code(s):     --- Professional --- ?                       [id number], Colonoscopy, flexible; diagnostic, including  ?                       collection of specimen(s) by brushing or washing, when  ?                       performed (separate procedure) ?Diagnosis Code(s):     --- Professional --- ?                       Z12.11, Encounter for screening for malignant neoplasm  ?                       of colon ?                       K57.30, Diverticulosis of large intestine  without  ?                       perforation or abscess without bleeding ?CPT copyright 2019 American Medical Association. All rights reserved. ?The codes documented in this report are preliminary and upon coder review may  ?be revised to meet current compliance requirements. ?[name], MD ?[name] MD, MD ?02/11/2022 8:06:29 AM ?This report has been signed electronically. ?Number of Addenda: 0 ?Note Initiated On: 02/11/2022 7:20 AM ?Scope Withdrawal Time: 0 hours 9 minutes 50 seconds  ?Total Procedure Duration: 0 hours 21 minutes 17 seconds  ?Estimated Blood Loss:  Estimated blood loss: none. ?     [facility] ?

## 2022-02-11 NOTE — Anesthesia Procedure Notes (Signed)
Procedure Name: General with mask airway ?Date/Time: 02/11/2022 8:00 AM ?Performed by: Kelton Pillar, CRNA ?Pre-anesthesia Checklist: Patient identified, Emergency Drugs available, Suction available and Patient being monitored ?Patient Re-evaluated:Patient Re-evaluated prior to induction ?Oxygen Delivery Method: Simple face mask ?Induction Type: IV induction ?Placement Confirmation: positive ETCO2, CO2 detector and breath sounds checked- equal and bilateral ?Dental Injury: Teeth and Oropharynx as per pre-operative assessment  ? ? ? ? ?

## 2022-02-11 NOTE — Anesthesia Preprocedure Evaluation (Signed)
Anesthesia Evaluation  ?Patient identified by MRN, date of birth, ID band ?Patient awake ? ? ? ?Reviewed: ?Allergy & Precautions, H&P , NPO status , Patient's Chart, lab work & pertinent test results, reviewed documented beta blocker date and time  ? ?Airway ?Mallampati: II ? ? ?Neck ROM: full ? ? ? Dental ? ?(+) Poor Dentition ?  ?Pulmonary ?neg pulmonary ROS,  ?  ?Pulmonary exam normal ? ? ? ? ? ? ? Cardiovascular ?Exercise Tolerance: Good ?hypertension, On Medications ?negative cardio ROS ?Normal cardiovascular exam ?Rhythm:regular Rate:Normal ? ? ?  ?Neuro/Psych ?Anxiety Depression negative neurological ROS ? negative psych ROS  ? GI/Hepatic ?Neg liver ROS, GERD  Medicated,  ?Endo/Other  ?negative endocrine ROS ? Renal/GU ?negative Renal ROS  ?negative genitourinary ?  ?Musculoskeletal ? ? Abdominal ?  ?Peds ? Hematology ?negative hematology ROS ?(+)   ?Anesthesia Other Findings ?Past Medical History: ?No date: Allergic rhinitis due to pollen ?No date: Blood transfusion without reported diagnosis ?12/29/2021: CKD (chronic kidney disease) stage 3, GFR 30-59 ml/min (HCC) ?No date: FIGO stage II endometrial cancer ([place]) ?    Comment:  [facility]. + malignant cells in peritoneal fluid.  ?No date: GERD (gastroesophageal reflux disease) ?No date: Hyperlipidemia ?No date: Hypertension ?No date: Major depressive disorder, recurrent episode with anxious  ?distress ([place]) ?No date: Osteoarthritis, multiple sites ?No date: Panic attacks ?2015: Personal history of radiation therapy ?    Comment:  uterine ca ?Past Surgical History: ?No date: ABDOMINAL HYSTERECTOMY ?No date: COLONOSCOPY ?No date: TONSILLECTOMY ?No date: TONSILLECTOMY AND ADENOIDECTOMY ?No date: TOTAL ABDOMINAL HYSTERECTOMY W/ BILATERAL SALPINGOOPHORECTOMY ?    Comment:  BSO ?BMI   ? Body Mass Index: 34.55 kg/m?  ?  ? Reproductive/Obstetrics ?negative OB ROS ? ?  ? ? ? ? ? ? ? ? ? ? ? ? ? ?  ?  ? ? ? ? ? ? ? ? ?Anesthesia  Physical ?Anesthesia Plan ? ?ASA: 3 ? ?Anesthesia Plan: General  ? ?Post-op Pain Management:   ? ?Induction:  ? ?PONV Risk Score and Plan:  ? ?Airway Management Planned:  ? ?Additional Equipment:  ? ?Intra-op Plan:  ? ?Post-operative Plan:  ? ?Informed Consent: I have reviewed the patients History and Physical, chart, labs and discussed the procedure including the risks, benefits and alternatives for the proposed anesthesia with the patient or authorized representative who has indicated his/her understanding and acceptance.  ? ? ? ?Dental Advisory Given ? ?Plan Discussed with: CRNA ? ?Anesthesia Plan Comments:   ? ? ? ? ? ? ?Anesthesia Quick Evaluation ? ?

## 2022-02-12 DIAGNOSIS — Z01419 Encounter for gynecological examination (general) (routine) without abnormal findings: Secondary | ICD-10-CM | POA: Diagnosis not present

## 2022-02-12 DIAGNOSIS — Z6834 Body mass index (BMI) 34.0-34.9, adult: Secondary | ICD-10-CM | POA: Diagnosis not present

## 2022-02-12 DIAGNOSIS — C541 Malignant neoplasm of endometrium: Secondary | ICD-10-CM | POA: Diagnosis not present

## 2022-02-13 ENCOUNTER — Encounter: Payer: Self-pay | Admitting: Gastroenterology

## 2022-02-18 ENCOUNTER — Telehealth: Payer: Self-pay | Admitting: Family Medicine

## 2022-02-18 DIAGNOSIS — N1831 Chronic kidney disease, stage 3a: Secondary | ICD-10-CM

## 2022-02-18 DIAGNOSIS — E782 Mixed hyperlipidemia: Secondary | ICD-10-CM

## 2022-02-18 NOTE — Telephone Encounter (Signed)
Ordered. She requests a call.    ICD-10-CM   1. Morbid obesity (HCC)  E66.01 Amb ref to Medical Nutrition Therapy-MNT    2. Stage 3a chronic kidney disease ([place])  N18.31 Amb ref to Medical Nutrition Therapy-MNT    3. Mixed hyperlipidemia  E78.2 Amb ref to Medical Nutrition Therapy-MNT       Orders placed today for conditions managed today: Orders Placed This Encounter  Procedures   Amb ref to Medical Nutrition Therapy-MNT

## 2022-02-18 NOTE — Telephone Encounter (Signed)
Pt called and said that Dr Leafy Ro referred her to [place] [facility] at [facility], she said she talked to [place] there and said the referral didn't mention anything about her kidney disease and that it would need to be on there for insurance to cover, they recommended the pateint call her PCP and have them put in the referral with the info. Call back for pt is [phone]

## 2022-02-18 NOTE — Telephone Encounter (Signed)
Ivin Booty notified by telephone that Dr. Lorelei Pont has placed the referral as requested.

## 2022-02-26 ENCOUNTER — Encounter: Payer: Self-pay | Admitting: Dietician

## 2022-02-26 ENCOUNTER — Encounter: Payer: Medicare Other | Attending: Family Medicine | Admitting: Dietician

## 2022-02-26 VITALS — Ht 65.0 in | Wt 211.8 lb

## 2022-02-26 DIAGNOSIS — E782 Mixed hyperlipidemia: Secondary | ICD-10-CM | POA: Diagnosis not present

## 2022-02-26 DIAGNOSIS — I10 Essential (primary) hypertension: Secondary | ICD-10-CM

## 2022-02-26 DIAGNOSIS — N1831 Chronic kidney disease, stage 3a: Secondary | ICD-10-CM | POA: Insufficient documentation

## 2022-02-26 DIAGNOSIS — Z6835 Body mass index (BMI) 35.0-35.9, adult: Secondary | ICD-10-CM | POA: Insufficient documentation

## 2022-02-26 DIAGNOSIS — E781 Pure hyperglyceridemia: Secondary | ICD-10-CM

## 2022-02-26 DIAGNOSIS — E7800 Pure hypercholesterolemia, unspecified: Secondary | ICD-10-CM

## 2022-02-26 NOTE — Patient Instructions (Signed)
Aim for '500mg'$  of sodium with each meal or less. Eat small-ish meat portions, generally no larger than the palm of a hand.  Include some carbs with meals for energy and to ensure adequate daily nutrition. Keep to 30-45grams with each meal.  Continue including exercise on a regular basis. Move for a few minutes every hour during the day.  Avoid sports drinks such as gatorade, powerade, underarmour, and propel brands. Look for sugar free flavored water without electrolytes (check sodium and potassium on the food label)

## 2022-02-26 NOTE — Progress Notes (Signed)
Medical Nutrition Therapy: Visit start time: 1050  end time: 1210  Assessment:  Diagnosis: CKD stage 3a, hyperlipidemia, obesity Past medical history: hypertension, diverticulosis, GERD, gout Psychosocial issues/ stress concerns:  high stress level, history of anxiety, depression  Preferred learning method:  Auditory Visual   Current weight: 211.8lbs with shoes Height: 5'5"  BMI: 35.25  Medications, supplements: reconciled list in medical record  Progress and evaluation:  CKD related to hypertension; also has prediabetes with HbA1C at 6.1% on 12/22/21  Other labs on 12/22/21: total cholesterol 186,  Avoids adding salt in cooking and at table, understands that there is significant sodium already in foods. Reports difficulty losing weight. Tried Optivia, but did not feel well with it. Tried intermittent fasting more recently, but is not consistent, has felt some low BG symptoms. Reports history of endometrial cancer, had hysterectomy and radiation. Back and knee pain as well as fatigue (since having COVID 09/2021) make regular physical activity difficult. Son bought a pedaling device which she is using with some regularity.  Physical activity: foot pedal machine 60 minutes 2-3 times a week  Dietary Intake:  Usual eating pattern includes 3 meals and 0-2 snacks per day. Dining out frequency: 0-1 meals per week.  Breakfast: none or eggs with sm amt shredded cheese and hot sauce Snack: none, not hungry until about 3pm Lunch: veg +/or chicken salad no bread Snack: none; occ coffee with creamer no sugar Supper: fish/ shrimp + veg; thin pork chop grilled + veg/ riced cauliflower; 5/31 stuffed pepper with riced cauliflower Snack: occasionally chicken salad, few bites; fruit cup Beverages: water 6-7 bottles daily; occasional gatorade; no sodas; rarely cranberry or cran grape juice; occ drink when out to eat or at bedtime  Intervention:   Nutrition Care Education:   Basic nutrition: basic  food groups; appropriate nutrient balance; appropriate meal and snack schedule; general nutrition guidelines    Weight control: importance of low sugar and low fat choices, portion control, estimated energy needs for weight loss at 1200 kcal, provided guidance for 50% CHO, 20% protein, 30% fat Advanced nutrition:  food label reading for carbohydrate, sodium CKD: importance of controlling BP and blood sugar; avoiding excess protein intake -- .8g/kg = 76g, advised 60g from high value protein sources; instructed on low sodium diet with ideal goal of '1500mg'$  daily, averaging '500mg'$  pre meal; discussed avoiding excessive potassium intake and identified high potassium foods; advised avoiding processed foods high in phosphorus; discussed monitoring potassium phosphorus, calcium levels and adjusting diet as needed to maintain normal levels. Pre-Diabetes:  appropriate meal and snack schedule, appropriate carb intake and balance, healthy carb choices, role of fiber, protein, fat Hypertension:  importance of controlling BP, identifying high sodium foods; role of exercise Hyperlipidemia: healthy and unhealthy fats    Nutritional Diagnosis:  [place]-2.1 Inpaired nutrition utilization and [place]-2.2 Altered nutrition-related laboratory As related to CKD stage 3a, pre-diabetes, hyperlipidemia, hypertension.  As evidenced by low GFR, elevated HbA1C, elevated triglycerides and VLDL. [place]-3.3 Overweight/obesity As related to history of excess calories, inadequate physical activity, stress.  As evidenced by patient with current BMI of 35.   Education Materials given:  Stage 3 Kidney Disease ([facility]) Kidney disease food pyramid ([place]) Plate Planner with food lists, sample meal pattern Sample menus Visit summary with goals/ instructions   Learner/ who was taught:  Patient    Level of understanding: Verbalizes/ demonstrates competency   Demonstrated degree of understanding via:   Teach back Learning  barriers: None  Willingness to learn/ readiness for  change: Eager, change in progress   Monitoring and Evaluation:  Dietary intake, exercise, renal function, BG control, BP, blood lipids, and body weight      follow up:  04/03/22 at 11:00am

## 2022-03-02 ENCOUNTER — Other Ambulatory Visit: Payer: Self-pay | Admitting: Family Medicine

## 2022-03-02 NOTE — Telephone Encounter (Signed)
Last office visit 12/29/2021 for CPE.  Last refilled:  Alprazolam 08/26/2021 for #90 with 3 refills.  Meloxicam 08/25/2021 for #90 with 1 refill.  Next Appt: 04/06/22 for 3 month follow up.

## 2022-03-05 ENCOUNTER — Other Ambulatory Visit: Payer: Self-pay | Admitting: Family Medicine

## 2022-03-05 DIAGNOSIS — E781 Pure hyperglyceridemia: Secondary | ICD-10-CM

## 2022-03-18 ENCOUNTER — Ambulatory Visit: Payer: Medicare Other | Admitting: Dietician

## 2022-04-03 ENCOUNTER — Ambulatory Visit: Payer: Medicare Other | Admitting: Dietician

## 2022-04-06 ENCOUNTER — Ambulatory Visit (INDEPENDENT_AMBULATORY_CARE_PROVIDER_SITE_OTHER): Payer: Medicare Other | Admitting: Family Medicine

## 2022-04-06 ENCOUNTER — Encounter: Payer: Self-pay | Admitting: Family Medicine

## 2022-04-06 VITALS — BP 120/70 | HR 73 | Temp 98.7°F | Ht 64.5 in | Wt 210.0 lb

## 2022-04-06 DIAGNOSIS — M159 Polyosteoarthritis, unspecified: Secondary | ICD-10-CM

## 2022-04-06 DIAGNOSIS — N1831 Chronic kidney disease, stage 3a: Secondary | ICD-10-CM

## 2022-04-06 DIAGNOSIS — M150 Primary generalized (osteo)arthritis: Secondary | ICD-10-CM

## 2022-04-06 DIAGNOSIS — I10 Essential (primary) hypertension: Secondary | ICD-10-CM | POA: Diagnosis not present

## 2022-04-06 LAB — BASIC METABOLIC PANEL
BUN: 22 mg/dL (ref 6–23)
CO2: 29 mEq/L (ref 19–32)
Calcium: 10 mg/dL (ref 8.4–10.5)
Chloride: 99 mEq/L (ref 96–112)
Creatinine, Ser: 1.21 mg/dL — ABNORMAL HIGH (ref 0.40–1.20)
GFR: 47.82 mL/min — ABNORMAL LOW (ref >60.00)
Glucose, Bld: 118 mg/dL — ABNORMAL HIGH (ref 70–99)
Potassium: 3.8 mEq/L (ref 3.5–5.1)
Sodium: 139 mEq/L (ref 135–145)

## 2022-04-06 MED ORDER — TRAMADOL HCL 50 MG PO TABS: 50.0000 mg | ORAL_TABLET | Freq: Four times a day (QID) | ORAL | 3 refills | Status: DC | PRN

## 2022-04-06 NOTE — Progress Notes (Signed)
Fred Hammes T. Ever Halberg, MD, [place] at [facility] [place], [address]  Phone: [phone]  FAX: [phone]  Jennifer Holt - 61 y.o. female  MRN [id number]  Date of Birth: May 25, 1961  Date: 04/06/2022  PCP: Owens Loffler, MD  Referral: Owens Loffler, MD  Chief Complaint  Patient presents with   Follow-up    3 month   Subjective:   Jennifer Holt is a 61 y.o. very pleasant female patient with Body mass index is 35.49 kg/m. who presents with the following:  She is primarily here to follow-up regarding her blood pressure.  On her last office visit I did add some amlodipine. Currently, she is on amlodipine 5 mg. She is also on hydrochlorothiazide 25 mg She also takes metoprolol 100 mg p.o. twice daily.  HTN: Tolerating all medications without side effects Stable and at goal No CP, no sob. No HA.  BP Readings from Last 3 Encounters:  04/06/22 120/70  02/11/22 116/76  12/29/21 (!) 937/90    Basic Metabolic Panel:    Component Value Date/Time   NA 138 12/22/2021 0907   NA 137 11/07/2013 1615   K 4.2 12/22/2021 0907   K 4.1 11/07/2013 1615   CL 100 12/22/2021 0907   CL 105 11/07/2013 1615   CO2 32 12/22/2021 0907   CO2 28 11/07/2013 1615   BUN 33 (H) 12/22/2021 0907   BUN 18 11/07/2013 1615   CREATININE 1.43 (H) 12/22/2021 0907   CREATININE 0.92 08/15/2014 0925   GLUCOSE 115 (H) 12/22/2021 0907   GLUCOSE 95 11/07/2013 1615   CALCIUM 10.0 12/22/2021 0907   CALCIUM 9.7 11/07/2013 1615    Follow-up chronic kidney disease, most recently patient's creatinine was 1.4 in March 2023.   Wt Readings from Last 3 Encounters:  04/06/22 210 lb (95.3 kg)  02/26/22 211 lb 12.8 oz (96.1 kg)  02/11/22 204 lb 6.6 oz (92.7 kg)    She appears somewhat anxious today.  Review of Systems is noted in the HPI, as appropriate  Objective:   BP 120/70   Pulse 73   Temp 98.7 F (37.1 C) (Oral)   Ht 5'  4.5" (1.638 m)   Wt 210 lb (95.3 kg)   SpO2 96%   BMI 35.49 kg/m   GEN: No acute distress; alert,appropriate. PULM: Breathing comfortably in no respiratory distress PSYCH: Normally interactive.  Anxious appearing CV: RRR, no m/g/r   Laboratory and Imaging Data: Results for orders placed or performed in visit on 12/22/21  Lipid panel  Result Value Ref Range   Cholesterol 186 0 - 200 mg/dL   Triglycerides 223.0 (H) 0.0 - 149.0 mg/dL   HDL 60.50 >39.00 mg/dL   VLDL 44.6 (H) 0.0 - 40.0 mg/dL   Total CHOL/HDL Ratio 3    NonHDL 125.16   Hepatic function panel  Result Value Ref Range   Total Bilirubin 0.6 0.2 - 1.2 mg/dL   Bilirubin, Direct 0.1 0.0 - 0.3 mg/dL   Alkaline Phosphatase 52 39 - 117 U/L   AST 19 0 - 37 U/L   ALT 16 0 - 35 U/L   Total Protein 7.3 6.0 - 8.3 g/dL   Albumin 4.8 3.5 - 5.2 g/dL  Basic metabolic panel  Result Value Ref Range   Sodium 138 135 - 145 mEq/L   Potassium 4.2 3.5 - 5.1 mEq/L   Chloride 100 96 - 112 mEq/L   CO2 32 19 - 32 mEq/L  Glucose, Bld 115 (H) 70 - 99 mg/dL   BUN 33 (H) 6 - 23 mg/dL   Creatinine, Ser 1.43 (H) 0.40 - 1.20 mg/dL   GFR 39.21 (L) >60.00 mL/min   Calcium 10.0 8.4 - 10.5 mg/dL  T3, free  Result Value Ref Range   T3, Free 2.9 2.3 - 4.2 pg/mL  T4, free  Result Value Ref Range   Free T4 0.78 0.60 - 1.60 ng/dL  TSH  Result Value Ref Range   TSH 4.82 0.35 - 5.50 uIU/mL  VITAMIN D 25 Hydroxy (Vit-D Deficiency, Fractures)  Result Value Ref Range   VITD 58.92 30.00 - 100.00 ng/mL  CBC with Differential/Platelet  Result Value Ref Range   WBC 4.8 4.0 - 10.5 K/uL   RBC 3.93 3.87 - 5.11 Mil/uL   Hemoglobin 12.3 12.0 - 15.0 g/dL   HCT 36.4 36.0 - 46.0 %   MCV 92.6 78.0 - 100.0 fl   MCHC 33.6 30.0 - 36.0 g/dL   RDW 12.7 11.5 - 15.5 %   Platelets 283.0 150.0 - 400.0 K/uL   Neutrophils Relative % 60.8 43.0 - 77.0 %   Lymphocytes Relative 28.3 12.0 - 46.0 %   Monocytes Relative 7.5 3.0 - 12.0 %   Eosinophils Relative 2.5 0.0 -  5.0 %   Basophils Relative 0.9 0.0 - 3.0 %   Neutro Abs 2.9 1.4 - 7.7 K/uL   Lymphs Abs 1.3 0.7 - 4.0 K/uL   Monocytes Absolute 0.4 0.1 - 1.0 K/uL   Eosinophils Absolute 0.1 0.0 - 0.7 K/uL   Basophils Absolute 0.0 0.0 - 0.1 K/uL  CA 125  Result Value Ref Range   CA 125 4 <35 U/mL  Hemoglobin A1c  Result Value Ref Range   Hgb A1c MFr Bld 6.1 4.6 - 6.5 %  LDL cholesterol, direct  Result Value Ref Range   Direct LDL 106.0 mg/dL     Assessment and Plan:     ICD-10-CM   1. Essential hypertension  I10     2. Stage 3a chronic kidney disease (HCC)  I71.24 Basic metabolic panel    3. Primary osteoarthritis involving multiple joints  M15.9      Blood pressure is stable with addition of amlodipine.  She is fairly worried about her chronic kidney disease.  I think that we just need to recheck this, her creatinine did worsen somewhat last time we checked it compared to her baseline.  I went over kidney function in general, and I think as long as this is stable, she just needs to have good blood pressure control and work on her weight.  Chronic back pain, relatively stable, she is managing things with some general OTC medications and some tramadol as well.  Medication Management during today's office visit: Meds ordered this encounter  Medications   traMADol (ULTRAM) 50 MG tablet    Sig: Take 1 tablet (50 mg total) by mouth every 6 (six) hours as needed.    Dispense:  60 tablet    Refill:  3    She needs a follow-up physical or general follow-up   Medications Discontinued During This Encounter  Medication Reason   traMADol (ULTRAM) 50 MG tablet Reorder    Orders placed today for conditions managed today: Orders Placed This Encounter  Procedures   Basic metabolic panel    Follow-up if needed: No follow-ups on file.  Dragon [employer] One speech-to-text software was used for transcription in this dictation.  Possible transcriptional errors can occur  using Editor, commissioning.    Signed,  Maud Deed. Kindal Ponti, MD   Outpatient Encounter Medications as of 04/06/2022  Medication Sig   ALPRAZolam (XANAX) 0.5 MG tablet TAKE 1 TABLET BY MOUTH 3 TIMES DAILY AS NEEDED   amitriptyline (ELAVIL) 25 MG tablet TAKE 1 TABLET BY MOUTH EVERY NIGHT AT BEDTIME   amLODipine (NORVASC) 5 MG tablet Take 1 tablet (5 mg total) by mouth daily.   atorvastatin (LIPITOR) 40 MG tablet TAKE 1 TABLET BY MOUTH EVERY NIGHT AT BEDTIME   buPROPion (WELLBUTRIN SR) 150 MG 12 hr tablet TAKE 1 TABLET BY MOUTH TWICE A DAY   calcium carbonate (TUMS) 500 MG chewable tablet Chew 1 tablet by mouth as needed for indigestion.   cetirizine (ZYRTEC) 10 MG tablet Take 10 mg by mouth daily as needed.   Cholecalciferol (VITAMIN D3) 50 MCG (2000 UT) TABS Take by mouth.   fenofibrate 160 MG tablet TAKE 1 TABLET BY MOUTH ONCE A DAY   fluticasone (FLONASE) 50 MCG/ACT nasal spray Place 2 sprays into both nostrils daily as needed.    hydrochlorothiazide (HYDRODIURIL) 25 MG tablet TAKE 1 TABLET BY MOUTH ONCE A DAY   meloxicam (MOBIC) 15 MG tablet TAKE 1 TABLET BY MOUTH ONCE A DAY   metoprolol tartrate (LOPRESSOR) 100 MG tablet TAKE 1 TABLET BY MOUTH TWICE A DAY   [DISCONTINUED] traMADol (ULTRAM) 50 MG tablet Take 1 tablet (50 mg total) by mouth every 6 (six) hours as needed.   traMADol (ULTRAM) 50 MG tablet Take 1 tablet (50 mg total) by mouth every 6 (six) hours as needed.   No facility-administered encounter medications on file as of 04/06/2022.

## 2022-04-08 ENCOUNTER — Other Ambulatory Visit: Payer: Self-pay | Admitting: Family Medicine

## 2022-05-04 ENCOUNTER — Other Ambulatory Visit: Payer: Self-pay | Admitting: *Deleted

## 2022-05-04 NOTE — Patient Outreach (Addendum)
  Care Coordination   05/04/2022 Name: REID NAWROT MRN: [id number] DOB: 1961/02/13   Care Coordination Outreach Attempts:  An unsuccessful telephone outreach was attempted today to offer the patient information about available care coordination services as a benefit of their health plan.   Follow Up Plan:  Additional outreach attempts will be made to offer the patient care coordination information and services.   Encounter Outcome:  No Answer  Care Coordination Interventions Activated:  Yes   Care Coordination Interventions:  No, not indicated    [facility] [place] [facility] [phone]

## 2022-05-11 ENCOUNTER — Telehealth: Payer: Self-pay | Admitting: Cardiology

## 2022-05-11 NOTE — Telephone Encounter (Signed)
Have made several attempts to schedule with no success Deleted recall 

## 2022-05-13 ENCOUNTER — Encounter: Payer: Self-pay | Admitting: Dietician

## 2022-05-13 NOTE — Progress Notes (Signed)
Have not heard back from patient to reschedule her missed appointment from 04/03/22. Sent notification to referring provider.

## 2022-05-25 ENCOUNTER — Other Ambulatory Visit: Payer: Self-pay | Admitting: Family Medicine

## 2022-07-27 ENCOUNTER — Other Ambulatory Visit: Payer: Self-pay | Admitting: Family Medicine

## 2022-07-27 NOTE — Telephone Encounter (Signed)
Last office visit 04/06/22 for 3 month follow up.  Last refilled alprazolam 03/02/22 for #90 with 3 refills.  Meloxicam 03/02/22 for #90 with 1 refill.  No future appointments with PCP.

## 2022-09-11 ENCOUNTER — Telehealth: Payer: Self-pay | Admitting: Family Medicine

## 2022-09-14 NOTE — Telephone Encounter (Signed)
Please call and schedule CPE with fasting labs prior after 12/30/2022 with Dr. Lorelei Pont.

## 2022-09-15 NOTE — Telephone Encounter (Signed)
Patient has been scheduled

## 2022-10-27 ENCOUNTER — Other Ambulatory Visit: Payer: Self-pay | Admitting: Family Medicine

## 2022-10-27 NOTE — Telephone Encounter (Signed)
Refill request Tramadol Last refill 04/06/22 #60/3 Last office visit 04/06/22 Upcoming appointment 01/18/23

## 2022-10-27 NOTE — Telephone Encounter (Signed)
Last office visit 04/06/22 for 3 month follow up.  Last refilled 04/06/22 for #60 with 3 refills.  CPE scheduled for 01/18/23.

## 2022-12-02 ENCOUNTER — Other Ambulatory Visit: Payer: Self-pay | Admitting: Family Medicine

## 2022-12-03 ENCOUNTER — Ambulatory Visit (INDEPENDENT_AMBULATORY_CARE_PROVIDER_SITE_OTHER): Payer: Medicare Other

## 2022-12-03 VITALS — Ht 64.0 in | Wt 210.0 lb

## 2022-12-03 DIAGNOSIS — Z1231 Encounter for screening mammogram for malignant neoplasm of breast: Secondary | ICD-10-CM

## 2022-12-03 DIAGNOSIS — Z0000 Encounter for general adult medical examination without abnormal findings: Secondary | ICD-10-CM | POA: Diagnosis not present

## 2022-12-03 DIAGNOSIS — Z780 Asymptomatic menopausal state: Secondary | ICD-10-CM | POA: Diagnosis not present

## 2022-12-03 NOTE — Patient Instructions (Signed)
Jennifer Holt , Thank you for taking time to come for your Medicare Wellness Visit. I appreciate your ongoing commitment to your health goals. Please review the following plan we discussed and let me know if I can assist you in the future.   These are the goals we discussed:  Goals      Patient Stated     Starting 10/26/2018, I will continue to take medications as prescribed.      Patient Stated     10/31/2019, I will try to work on losing some weight once the pandemic gets under control.     Patient Stated     11/29/2020, I will maintain and continue medications as prescribed.      Patient Stated     Would like to eat healthier and start walking      Patient Stated     Lose weight.        This is a list of the screening recommended for you and due dates:  Health Maintenance  Topic Date Due   COVID-19 Vaccine (1) Never done   DTaP/Tdap/Td vaccine (1 - Tdap) Never done   Zoster (Shingles) Vaccine (1 of 2) Never done   Mammogram  07/08/2019   Flu Shot  Never done   Medicare Annual Wellness Visit  12/03/2023   Colon Cancer Screening  02/12/2032   Hepatitis C Screening: USPSTF Recommendation to screen - Ages 18-79 yo.  Completed   HIV Screening  Completed   HPV Vaccine  Aged Out   Pap Smear  Discontinued   Opioid Pain Medicine Management Opioids are powerful medicines that are used to treat moderate to severe pain. When used for short periods of time, they can help you to: Sleep better. Do better in physical or occupational therapy. Feel better in the first few days after an injury. Recover from surgery. Opioids should be taken with the supervision of a trained health care provider. They should be taken for the shortest period of time possible. This is because opioids can be addictive, and the longer you take opioids, the greater your risk of addiction. This addiction can also be called opioid use disorder. What are the risks? Using opioid pain medicines for longer than 3 days  increases your risk of side effects. Side effects include: Constipation. Nausea and vomiting. Breathing difficulties (respiratory depression). Drowsiness. Confusion. Opioid use disorder. Itching. Taking opioid pain medicine for a long period of time can affect your ability to do daily tasks. It also puts you at risk for: Motor vehicle crashes. Depression. Suicide. Heart attack. Overdose, which can be life-threatening. What is a pain treatment plan? A pain treatment plan is an agreement between you and your health care provider. Pain is unique to each person, and treatments vary depending on your condition. To manage your pain, you and your health care provider need to work together. To help you do this: Discuss the goals of your treatment, including how much pain you might expect to have and how you will manage the pain. Review the risks and benefits of taking opioid medicines. Remember that a good treatment plan uses more than one approach and minimizes the chance of side effects. Be honest about the amount of medicines you take and about any drug or alcohol use. Get pain medicine prescriptions from only one health care provider. Pain can be managed with many types of alternative treatments. Ask your health care provider to refer you to one or more specialists who can help you manage  pain through: Physical or occupational therapy. Counseling (cognitive behavioral therapy). Good nutrition. Biofeedback. Massage. Meditation. Non-opioid medicine. Following a gentle exercise program. How to use opioid pain medicine Taking medicine Take your pain medicine exactly as told by your health care provider. Take it only when you need it. If your pain gets less severe, you may take less than your prescribed dose if your health care provider approves. If you are not having pain, do nottake pain medicine unless your health care provider tells you to take it. If your pain is severe, do nottry to  treat it yourself by taking more pills than instructed on your prescription. Contact your health care provider for help. Write down the times when you take your pain medicine. It is easy to become confused while on pain medicine. Writing the time can help you avoid overdose. Take other over-the-counter or prescription medicines only as told by your health care provider. Keeping yourself and others safe  While you are taking opioid pain medicine: Do not drive, use machinery, or power tools. Do not sign legal documents. Do not drink alcohol. Do not take sleeping pills. Do not supervise children by yourself. Do not do activities that require climbing or being in high places. Do not go to a lake, river, ocean, spa, or swimming pool. Do not share your pain medicine with anyone. Keep pain medicine in a locked cabinet or in a secure area where pets and children cannot reach it. Stopping your use of opioids If you have been taking opioid medicine for more than a few weeks, you may need to slowly decrease (taper) how much you take until you stop completely. Tapering your use of opioids can decrease your risk of symptoms of withdrawal, such as: Pain and cramping in the abdomen. Nausea. Sweating. Sleepiness. Restlessness. Uncontrollable shaking (tremors). Cravings for the medicine. Do not attempt to taper your use of opioids on your own. Talk with your health care provider about how to do this. Your health care provider may prescribe a step-down schedule based on how much medicine you are taking and how long you have been taking it. Getting rid of leftover pills Do not save any leftover pills. Get rid of leftover pills safely by: Taking the medicine to a prescription take-back program. This is usually offered by the county or law enforcement. Bringing them to a pharmacy that has a drug disposal container. Flushing them down the toilet. Check the label or package insert of your medicine to see  whether this is safe to do. Throwing them out in the trash. Check the label or package insert of your medicine to see whether this is safe to do. If it is safe to throw it out, remove the medicine from the original container, put it into a sealable bag or container, and mix it with used coffee grounds, food scraps, dirt, or cat litter before putting it in the trash. Follow these instructions at home: Activity Do exercises as told by your health care provider. Avoid activities that make your pain worse. Return to your normal activities as told by your health care provider. Ask your health care provider what activities are safe for you. General instructions You may need to take these actions to prevent or treat constipation: Drink enough fluid to keep your urine pale yellow. Take over-the-counter or prescription medicines. Eat foods that are high in fiber, such as beans, whole grains, and fresh fruits and vegetables. Limit foods that are high in fat and processed sugars, such  as fried or sweet foods. Keep all follow-up visits. This is important. Where to find support If you have been taking opioids for a long time, you may benefit from receiving support for quitting from a local support group or counselor. Ask your health care provider for a referral to these resources in your area. Where to find more information Centers for Disease Control and Prevention (CDC): http://www.wolf.info/ [place] Food and Drug Administration (FDA): [place] Get help right away if: You may have taken too much of an opioid (overdosed). Common symptoms of an overdose: Your breathing is slower or more shallow than normal. You have a very slow heartbeat (pulse). You have slurred speech. You have nausea and vomiting. Your pupils become very small. You have other potential symptoms: You are very confused. You faint or feel like you will faint. You have cold, clammy skin. You have blue lips or fingernails. You have thoughts  of harming yourself or harming others. These symptoms may represent a serious problem that is an emergency. Do not wait to see if the symptoms will go away. Get medical help right away. Call your local emergency services (911 in the [place] Do not drive yourself to the hospital.  If you ever feel like you may hurt yourself or others, or have thoughts about taking your own life, get help right away. Go to your nearest emergency department or: Call your local emergency services (911 in the [place] Call the [facility] 203-440-8731 in the [place] Call a suicide crisis helpline, such as the [facility] at 912-599-7368 or 988 in the [place]. This is open 24 hours a day in the [place] Text the Crisis Text Line at (202)382-3536 (in the [place].). Summary Opioid medicines can help you manage moderate to severe pain for a short period of time. A pain treatment plan is an agreement between you and your health care provider. Discuss the goals of your treatment, including how much pain you might expect to have and how you will manage the pain. If you think that you or someone else may have taken too much of an opioid, get medical help right away. This information is not intended to replace advice given to you by your health care provider. Make sure you discuss any questions you have with your health care provider. Document Revised: 04/09/2021 Document Reviewed: 12/25/2020 Elsevier Patient Education  [place] directives: Advance directive discussed with you today. Even though you declined this today, please call our office should you change your mind, and we can give you the proper paperwork for you to fill out.   Conditions/risks identified: Aim for 30 minutes of exercise or brisk walking, 6-8 glasses of water, and 5 servings of fruits and vegetables each day.   Next appointment: Follow up in one year for your annual wellness visit 12/08/2023 @ 9:15 via  telephone.   Preventive Care 54 Years and Older, Female Preventive care refers to lifestyle choices and visits with your health care provider that can promote health and wellness. What does preventive care include? A yearly physical exam. This is also called an annual well check. Dental exams once or twice a year. Routine eye exams. Ask your health care provider how often you should have your eyes checked. Personal lifestyle choices, including: Daily care of your teeth and gums. Regular physical activity. Eating a healthy diet. Avoiding tobacco and drug use. Limiting alcohol use. Practicing safe sex. Taking low-dose aspirin every day. Taking vitamin and  mineral supplements as recommended by your health care provider. What happens during an annual well check? The services and screenings done by your health care provider during your annual well check will depend on your age, overall health, lifestyle risk factors, and family history of disease. Counseling  Your health care provider may ask you questions about your: Alcohol use. Tobacco use. Drug use. Emotional well-being. Home and relationship well-being. Sexual activity. Eating habits. History of falls. Memory and ability to understand (cognition). Work and work Statistician. Reproductive health. Screening  You may have the following tests or measurements: Height, weight, and BMI. Blood pressure. Lipid and cholesterol levels. These may be checked every 5 years, or more frequently if you are over 109 years old. Skin check. Lung cancer screening. You may have this screening every year starting at age 55 if you have a 30-pack-year history of smoking and currently smoke or have quit within the past 15 years. Fecal occult blood test (FOBT) of the stool. You may have this test every year starting at age 36. Flexible sigmoidoscopy or colonoscopy. You may have a sigmoidoscopy every 5 years or a colonoscopy every 10 years starting at age  73. Hepatitis C blood test. Hepatitis B blood test. Sexually transmitted disease (STD) testing. Diabetes screening. This is done by checking your blood sugar (glucose) after you have not eaten for a while (fasting). You may have this done every 1-3 years. Bone density scan. This is done to screen for osteoporosis. You may have this done starting at age 64. Mammogram. This may be done every 1-2 years. Talk to your health care provider about how often you should have regular mammograms. Talk with your health care provider about your test results, treatment options, and if necessary, the need for more tests. Vaccines  Your health care provider may recommend certain vaccines, such as: Influenza vaccine. This is recommended every year. Tetanus, diphtheria, and acellular pertussis (Tdap, Td) vaccine. You may need a Td booster every 10 years. Zoster vaccine. You may need this after age 11. Pneumococcal 13-valent conjugate (PCV13) vaccine. One dose is recommended after age 59. Pneumococcal polysaccharide (PPSV23) vaccine. One dose is recommended after age 21. Talk to your health care provider about which screenings and vaccines you need and how often you need them. This information is not intended to replace advice given to you by your health care provider. Make sure you discuss any questions you have with your health care provider. Document Released: 10/11/2015 Document Revised: 06/03/2016 Document Reviewed: 07/16/2015 Elsevier Interactive Patient Education  2017 [place] Prevention in the Home Falls can cause injuries. They can happen to people of all ages. There are many things you can do to make your home safe and to help prevent falls. What can I do on the outside of my home? Regularly fix the edges of walkways and driveways and fix any cracks. Remove anything that might make you trip as you walk through a door, such as a raised step or threshold. Trim any bushes or trees on the  path to your home. Use bright outdoor lighting. Clear any walking paths of anything that might make someone trip, such as rocks or tools. Regularly check to see if handrails are loose or broken. Make sure that both sides of any steps have handrails. Any raised decks and porches should have guardrails on the edges. Have any leaves, snow, or ice cleared regularly. Use sand or salt on walking paths during winter. Clean up any spills in  your garage right away. This includes oil or grease spills. What can I do in the bathroom? Use night lights. Install grab bars by the toilet and in the tub and shower. Do not use towel bars as grab bars. Use non-skid mats or decals in the tub or shower. If you need to sit down in the shower, use a plastic, non-slip stool. Keep the floor dry. Clean up any water that spills on the floor as soon as it happens. Remove soap buildup in the tub or shower regularly. Attach bath mats securely with double-sided non-slip rug tape. Do not have throw rugs and other things on the floor that can make you trip. What can I do in the bedroom? Use night lights. Make sure that you have a light by your bed that is easy to reach. Do not use any sheets or blankets that are too big for your bed. They should not hang down onto the floor. Have a firm chair that has side arms. You can use this for support while you get dressed. Do not have throw rugs and other things on the floor that can make you trip. What can I do in the kitchen? Clean up any spills right away. Avoid walking on wet floors. Keep items that you use a lot in easy-to-reach places. If you need to reach something above you, use a strong step stool that has a grab bar. Keep electrical cords out of the way. Do not use floor polish or wax that makes floors slippery. If you must use wax, use non-skid floor wax. Do not have throw rugs and other things on the floor that can make you trip. What can I do with my stairs? Do not  leave any items on the stairs. Make sure that there are handrails on both sides of the stairs and use them. Fix handrails that are broken or loose. Make sure that handrails are as long as the stairways. Check any carpeting to make sure that it is firmly attached to the stairs. Fix any carpet that is loose or worn. Avoid having throw rugs at the top or bottom of the stairs. If you do have throw rugs, attach them to the floor with carpet tape. Make sure that you have a light switch at the top of the stairs and the bottom of the stairs. If you do not have them, ask someone to add them for you. What else can I do to help prevent falls? Wear shoes that: Do not have high heels. Have rubber bottoms. Are comfortable and fit you well. Are closed at the toe. Do not wear sandals. If you use a stepladder: Make sure that it is fully opened. Do not climb a closed stepladder. Make sure that both sides of the stepladder are locked into place. Ask someone to hold it for you, if possible. Clearly mark and make sure that you can see: Any grab bars or handrails. First and last steps. Where the edge of each step is. Use tools that help you move around (mobility aids) if they are needed. These include: Canes. Walkers. Scooters. Crutches. Turn on the lights when you go into a dark area. Replace any light bulbs as soon as they burn out. Set up your furniture so you have a clear path. Avoid moving your furniture around. If any of your floors are uneven, fix them. If there are any pets around you, be aware of where they are. Review your medicines with your doctor. Some medicines can  make you feel dizzy. This can increase your chance of falling. Ask your doctor what other things that you can do to help prevent falls. This information is not intended to replace advice given to you by your health care provider. Make sure you discuss any questions you have with your health care provider. Document Released:  07/11/2009 Document Revised: 02/20/2016 Document Reviewed: 10/19/2014 Elsevier Interactive Patient Education  2017 [employer].

## 2022-12-03 NOTE — Progress Notes (Signed)
I connected with  Dorthula Rue on 12/03/22 by a audio enabled telemedicine application and verified that I am speaking with the correct person using two identifiers.  Patient Location: Home  Provider Location: Home Office  I discussed the limitations of evaluation and management by telemedicine. The patient expressed understanding and agreed to proceed.  Subjective:   Jennifer Holt is a 62 y.o. female who presents for Medicare Annual (Subsequent) preventive examination.  Review of Systems      Cardiac Risk Factors include: advanced age (>70mn, >>68women);hypertension     Objective:    Today's Vitals   12/03/22 0859  Weight: 210 lb (95.3 kg)  Height: '5\' 4"'$  (1.626 m)   Body mass index is 36.05 kg/m.     12/03/2022    9:12 AM 02/26/2022   11:10 AM 02/11/2022    6:56 AM 12/01/2021   10:36 AM 11/29/2020   10:26 AM 10/31/2019    2:45 PM 10/26/2018   10:52 AM  Advanced Directives  Does Patient Have a Medical Advance Directive? No No No No No No No  Does patient want to make changes to medical advance directive?     No - Patient declined    Would patient like information on creating a medical advance directive? No - Patient declined No - Patient declined  Yes (MAU/Ambulatory/Procedural Areas - Information given)  No - Patient declined No - Patient declined    Current Medications (verified) Outpatient Encounter Medications as of 12/03/2022  Medication Sig   ALPRAZolam (XANAX) 0.5 MG tablet TAKE 1 TABLET BY MOUTH 3 TIMES DAILY AS NEEDED   amitriptyline (ELAVIL) 25 MG tablet TAKE 1 TABLET BY MOUTH EVERY NIGHT AT BEDTIME   amLODipine (NORVASC) 5 MG tablet TAKE ONE TABLET BY MOUTH ONCE A DAY   atorvastatin (LIPITOR) 40 MG tablet TAKE 1 TABLET BY MOUTH EVERY NIGHT AT BEDTIME   buPROPion (WELLBUTRIN SR) 150 MG 12 hr tablet TAKE 1 TABLET BY MOUTH TWICE A DAY   calcium carbonate (TUMS) 500 MG chewable tablet Chew 1 tablet by mouth as needed for indigestion.   cetirizine (ZYRTEC) 10 MG  tablet Take 10 mg by mouth daily as needed.   Cholecalciferol (VITAMIN D3) 50 MCG (2000 UT) TABS Take by mouth.   fenofibrate 160 MG tablet TAKE 1 TABLET BY MOUTH ONCE A DAY   fluticasone (FLONASE) 50 MCG/ACT nasal spray Place 2 sprays into both nostrils daily as needed.    hydrochlorothiazide (HYDRODIURIL) 25 MG tablet TAKE 1 TABLET BY MOUTH ONCE A DAY   meloxicam (MOBIC) 15 MG tablet TAKE 1 TABLET BY MOUTH ONCE A DAY   metoprolol tartrate (LOPRESSOR) 100 MG tablet TAKE 1 TABLET BY MOUTH TWICE A DAY   traMADol (ULTRAM) 50 MG tablet TAKE 1 TABLET BY MOUTH EVERY 6 HOURS AS NEEDED (NEED FOLLOW UP VISIT)   No facility-administered encounter medications on file as of 12/03/2022.    Allergies (verified) Ace inhibitors and Prednisone   History: Past Medical History:  Diagnosis Date   Allergic rhinitis due to pollen    Blood transfusion without reported diagnosis    CKD (chronic kidney disease) stage 3, GFR 30-59 ml/min (HCC) 12/29/2021   FIGO stage II endometrial cancer ([facility]    [facility]. + malignant cells in peritoneal fluid.    GERD (gastroesophageal reflux disease)    Hyperlipidemia    Hypertension    Major depressive disorder, recurrent episode with anxious distress (HCC)    Osteoarthritis, multiple sites  Panic attacks    Personal history of radiation therapy 2015   uterine ca   Past Surgical History:  Procedure Laterality Date   ABDOMINAL HYSTERECTOMY     COLONOSCOPY     COLONOSCOPY WITH PROPOFOL N/A 02/11/2022   Procedure: COLONOSCOPY WITH PROPOFOL;  Surgeon: [name], MD;  Location: [facility] ENDOSCOPY;  Service: Gastroenterology;  Laterality: N/A;   TONSILLECTOMY     TONSILLECTOMY AND ADENOIDECTOMY     TOTAL ABDOMINAL HYSTERECTOMY W/ BILATERAL SALPINGOOPHORECTOMY     BSO   Family History  Problem Relation Age of Onset   Arthritis Mother    Hyperlipidemia Mother    Stroke Mother    Hypertension Mother    Mental illness Mother    Hyperlipidemia Father    Heart  disease Father    Hypertension Father    Mental illness Brother    Stroke Maternal Grandmother    Hypertension Maternal Grandmother    Mental illness Brother    Breast cancer Sister    Breast cancer Paternal Aunt    Cancer Paternal Aunt    Social History   Socioeconomic History   Marital status: Divorced    Spouse name: Not on file   Number of children: Not on file   Years of education: Not on file   Highest education level: Not on file  Occupational History   Not on file  Tobacco Use   Smoking status: Never   Smokeless tobacco: Never  Vaping Use   Vaping Use: Never used  Substance and Sexual Activity   Alcohol use: Yes    Comment: occassionally   Drug use: No   Sexual activity: Yes    Birth control/protection: Surgical  Other Topics Concern   Not on file  Social History Narrative   Not on file   Social Determinants of Health   Financial Resource Strain: Medium Risk (12/03/2022)   Overall Financial Resource Strain (CARDIA)    Difficulty of Paying Living Expenses: Somewhat hard  Food Insecurity: No Food Insecurity (12/03/2022)   Hunger Vital Sign    Worried About Running Out of Food in the Last Year: Never true    Ran Out of Food in the Last Year: Never true  Transportation Needs: No Transportation Needs (12/03/2022)   PRAPARE - Hydrologist (Medical): No    Lack of Transportation (Non-Medical): No  Physical Activity: Insufficiently Active (12/03/2022)   Exercise Vital Sign    Days of Exercise per Week: 2 days    Minutes of Exercise per Session: 30 min  Stress: No Stress Concern Present (12/03/2022)   [place]    Feeling of Stress : Not at all  Social Connections: Socially Isolated (12/03/2022)   Social Connection and Isolation Panel [NHANES]    Frequency of Communication with Friends and Family: More than three times a week    Frequency of Social Gatherings with Friends and  Family: More than three times a week    Attends Religious Services: Never    Marine scientist or Organizations: No    Attends Music therapist: Never    Marital Status: Divorced    Tobacco Counseling Counseling given: Not Answered   Clinical Intake:  Pre-visit preparation completed: Yes  Pain : No/denies pain     Nutritional Risks: None Diabetes: No  How often do you need to have someone help you when you read instructions, pamphlets, or other written materials from your  doctor or pharmacy?: 1 - Never  Diabetic? no  Interpreter Needed?: No  Information entered by :: C.Lavere Stork LPN   Activities of Daily Living    12/03/2022    9:14 AM  In your present state of health, do you have any difficulty performing the following activities:  Hearing? 0  Vision? 0  Difficulty concentrating or making decisions? 1  Comment sometimes  Walking or climbing stairs? 0  Dressing or bathing? 0  Doing errands, shopping? 0  Preparing Food and eating ? N  Using the Toilet? N  In the past six months, have you accidently leaked urine? N  Do you have problems with loss of bowel control? N  Managing your Medications? N  Managing your Finances? N  Housekeeping or managing your Housekeeping? N    Patient Care Team: Owens Loffler, MD as PCP - General (Family Medicine) Kate Sable, MD as PCP - Cardiology (Cardiology)  Indicate any recent Medical Services you may have received from other than [facility] providers in the past year (date may be approximate).     Assessment:   This is a routine wellness examination for Jennifer Holt.  Hearing/Vision screen Hearing Screening - Comments:: No aids Vision Screening - Comments:: Glasses - Dr.Gupta in [place]  Dietary issues and exercise activities discussed: Current Exercise Habits: The patient does not participate in regular exercise at present, Exercise limited by: orthopedic condition(s) (back pain)   Goals Addressed              This Visit's Progress    Patient Stated       Lose weight.       Depression Screen    12/03/2022    9:12 AM 12/03/2022    9:10 AM 04/06/2022   10:55 AM 02/26/2022   11:11 AM 12/29/2021   11:40 AM 12/01/2021   10:40 AM 11/29/2020   10:28 AM  PHQ 2/9 Scores  PHQ - 2 Score '1 1 3 1 4 '$ 0 0  PHQ- 9 Score   8  10  0    Fall Risk    12/03/2022    9:13 AM 02/26/2022   11:11 AM 12/01/2021   10:38 AM 11/29/2020   10:26 AM 10/31/2019    2:47 PM  Fall Risk   Falls in the past year? 0 0 0 0 0  Number falls in past yr: 0  0 0 0  Injury with Fall? 0  0 0 0  Risk for fall due to : No Fall Risks  No Fall Risks Medication side effect Medication side effect  Follow up Falls prevention discussed;Falls evaluation completed  Falls prevention discussed Falls evaluation completed;Falls prevention discussed Falls evaluation completed;Falls prevention discussed    FALL RISK PREVENTION PERTAINING TO THE HOME:  Any stairs in or around the home? Yes  If so, are there any without handrails? Yes  Home free of loose throw rugs in walkways, pet beds, electrical cords, etc? Yes  Adequate lighting in your home to reduce risk of falls? Yes   ASSISTIVE DEVICES UTILIZED TO PREVENT FALLS:  Life alert? No  Use of a cane, walker or w/c? No  Grab bars in the bathroom? No  Shower chair or bench in shower? Yes  Elevated toilet seat or a handicapped toilet? Yes    Cognitive Function:    11/29/2020   10:30 AM 10/31/2019    2:50 PM 10/26/2018   10:52 AM  MMSE - Mini Mental State Exam  Orientation to time 5 5 5  Orientation to Place '5 5 5  '$ Registration '3 3 3  '$ Attention/ Calculation 5 5 0  Recall '3 3 3  '$ Language- name 2 objects   0  Language- repeat '1 1 1  '$ Language- follow 3 step command   3  Language- read & follow direction   0  Write a sentence   0  Copy design   0  Total score   20        12/03/2022    9:15 AM  6CIT Screen  What Year? 0 points  What month? 0 points  What time? 0 points  Count back from  20 0 points  Months in reverse 0 points  Repeat phrase 4 points  Total Score 4 points    Immunizations  There is no immunization history on file for this patient.  TDAP status: Due, Education has been provided regarding the importance of this vaccine. Advised may receive this vaccine at local pharmacy or Health Dept. Aware to provide a copy of the vaccination record if obtained from local pharmacy or Health Dept. Verbalized acceptance and understanding.  Flu Vaccine status: Declined, Education has been provided regarding the importance of this vaccine but patient still declined. Advised may receive this vaccine at local pharmacy or Health Dept. Aware to provide a copy of the vaccination record if obtained from local pharmacy or Health Dept. Verbalized acceptance and understanding.  Pneumococcal vaccine status: Declined,  Education has been provided regarding the importance of this vaccine but patient still declined. Advised may receive this vaccine at local pharmacy or Health Dept. Aware to provide a copy of the vaccination record if obtained from local pharmacy or Health Dept. Verbalized acceptance and understanding.   Covid-19 vaccine status: Declined, Education has been provided regarding the importance of this vaccine but patient still declined. Advised may receive this vaccine at local pharmacy or Health Dept.or vaccine clinic. Aware to provide a copy of the vaccination record if obtained from local pharmacy or Health Dept. Verbalized acceptance and understanding.  Qualifies for Shingles Vaccine? Yes   Zostavax completed No   Shingrix Completed?: No.    Education has been provided regarding the importance of this vaccine. Patient has been advised to call insurance company to determine out of pocket expense if they have not yet received this vaccine. Advised may also receive vaccine at local pharmacy or Health Dept. Verbalized acceptance and understanding.  Screening Tests Health  Maintenance  Topic Date Due   COVID-19 Vaccine (1) Never done   DTaP/Tdap/Td (1 - Tdap) Never done   Zoster Vaccines- Shingrix (1 of 2) Never done   MAMMOGRAM  07/08/2019   INFLUENZA VACCINE  Never done   Medicare Annual Wellness (AWV)  12/03/2023   COLONOSCOPY (Pts 45-28yr Insurance coverage will need to be confirmed)  02/12/2032   Hepatitis C Screening  Completed   HIV Screening  Completed   HPV VACCINES  Aged Out   PAP SMEAR-Modifier  Discontinued    Health Maintenance  Health Maintenance Due  Topic Date Due   COVID-19 Vaccine (1) Never done   DTaP/Tdap/Td (1 - Tdap) Never done   Zoster Vaccines- Shingrix (1 of 2) Never done   MAMMOGRAM  07/08/2019   INFLUENZA VACCINE  Never done    Colorectal cancer screening: Type of screening: Colonoscopy. Completed 02/11/2022. Repeat every 10 years  Mammogram status: Ordered 07/07/18. Pt provided with contact info and advised to call to schedule appt.   Bone Density status: Ordered 03/21/2014. Pt provided with  contact info and advised to call to schedule appt.  Lung Cancer Screening: (Low Dose CT Chest recommended if Age 78-80 years, 30 pack-year currently smoking OR have quit w/in 15years.) does not qualify.   Lung Cancer Screening Referral: no  Additional Screening:  Hepatitis C Screening: does not qualify; Completed 10/26/2018  Vision Screening: Recommended annual ophthalmology exams for early detection of glaucoma and other disorders of the eye. Is the patient up to date with their annual eye exam?  No  last exam 2022 Who is the provider or what is the name of the office in which the patient attends annual eye exams? Dr.Rhulk [facility] in [place] If pt is not established with a provider, would they like to be referred to a provider to establish care? No .   Dental Screening: Recommended annual dental exams for proper oral hygiene  Community Resource Referral / Chronic Care Management: CRR required this visit?  No   CCM  required this visit?  No      Plan:     I have personally reviewed and noted the following in the patient's chart:   Medical and social history Use of alcohol, tobacco or illicit drugs  Current medications and supplements including opioid prescriptions. Patient is currently taking opioid prescriptions. Information provided to patient regarding non-opioid alternatives. Patient advised to discuss non-opioid treatment plan with their provider. Functional ability and status Nutritional status Physical activity Advanced directives List of other physicians Hospitalizations, surgeries, and ER visits in previous 12 months Vitals Screenings to include cognitive, depression, and falls Referrals and appointments  In addition, I have reviewed and discussed with patient certain preventive protocols, quality metrics, and best practice recommendations. A written personalized care plan for preventive services as well as general preventive health recommendations were provided to patient.     Lebron Conners, LPN   [id number]   Nurse Notes: Order placed for mammogram and dexa scan at pt's request. Vaccinations: declines all Influenza vaccine: recommend every Fall Pneumococcal vaccine: recommend once per lifetime Prevnar-20 Tdap vaccine: recommend every 10 years Shingles vaccine: recommend Shingrix which is 2 doses 2-6 months apart and over 90% effective     Covid-19: recommend 2 doses one month apart with a booster 6 months later

## 2022-12-16 ENCOUNTER — Other Ambulatory Visit: Payer: Self-pay | Admitting: Family Medicine

## 2023-01-07 ENCOUNTER — Other Ambulatory Visit: Payer: Self-pay | Admitting: Family Medicine

## 2023-01-07 DIAGNOSIS — E559 Vitamin D deficiency, unspecified: Secondary | ICD-10-CM

## 2023-01-07 DIAGNOSIS — R739 Hyperglycemia, unspecified: Secondary | ICD-10-CM

## 2023-01-07 DIAGNOSIS — E7800 Pure hypercholesterolemia, unspecified: Secondary | ICD-10-CM

## 2023-01-07 DIAGNOSIS — N1831 Chronic kidney disease, stage 3a: Secondary | ICD-10-CM

## 2023-01-07 DIAGNOSIS — Z79899 Other long term (current) drug therapy: Secondary | ICD-10-CM

## 2023-01-07 DIAGNOSIS — E038 Other specified hypothyroidism: Secondary | ICD-10-CM

## 2023-01-07 DIAGNOSIS — C541 Malignant neoplasm of endometrium: Secondary | ICD-10-CM

## 2023-01-11 ENCOUNTER — Encounter: Payer: Medicare Other | Admitting: Family Medicine

## 2023-01-11 ENCOUNTER — Other Ambulatory Visit (INDEPENDENT_AMBULATORY_CARE_PROVIDER_SITE_OTHER): Payer: Medicare Other

## 2023-01-11 DIAGNOSIS — E559 Vitamin D deficiency, unspecified: Secondary | ICD-10-CM | POA: Diagnosis not present

## 2023-01-11 DIAGNOSIS — Z79899 Other long term (current) drug therapy: Secondary | ICD-10-CM

## 2023-01-11 DIAGNOSIS — R739 Hyperglycemia, unspecified: Secondary | ICD-10-CM | POA: Diagnosis not present

## 2023-01-11 DIAGNOSIS — E7800 Pure hypercholesterolemia, unspecified: Secondary | ICD-10-CM

## 2023-01-11 DIAGNOSIS — C541 Malignant neoplasm of endometrium: Secondary | ICD-10-CM

## 2023-01-11 DIAGNOSIS — E038 Other specified hypothyroidism: Secondary | ICD-10-CM

## 2023-01-11 LAB — BASIC METABOLIC PANEL
BUN: 23 mg/dL (ref 6–23)
CO2: 31 mEq/L (ref 19–32)
Calcium: 9.4 mg/dL (ref 8.4–10.5)
Chloride: 99 mEq/L (ref 96–112)
Creatinine, Ser: 1.08 mg/dL (ref 0.40–1.20)
GFR: 54.51 mL/min — ABNORMAL LOW (ref >60.00)
Glucose, Bld: 113 mg/dL — ABNORMAL HIGH (ref 70–99)
Potassium: 3.7 mEq/L (ref 3.5–5.1)
Sodium: 141 mEq/L (ref 135–145)

## 2023-01-11 LAB — T3, FREE: T3, Free: 2.8 pg/mL (ref 2.3–4.2)

## 2023-01-11 LAB — LIPID PANEL
Cholesterol: 161 mg/dL (ref 0–200)
HDL: 58.3 mg/dL (ref >39.00)
LDL Cholesterol: 67 mg/dL (ref 0–99)
NonHDL: 102.69
Total CHOL/HDL Ratio: 3
Triglycerides: 180 mg/dL — ABNORMAL HIGH (ref 0.0–149.0)
VLDL: 36 mg/dL (ref 0.0–40.0)

## 2023-01-11 LAB — HEPATIC FUNCTION PANEL
ALT: 18 U/L (ref 0–35)
AST: 21 U/L (ref 0–37)
Albumin: 4.3 g/dL (ref 3.5–5.2)
Alkaline Phosphatase: 56 U/L (ref 39–117)
Bilirubin, Direct: 0.1 mg/dL (ref 0.0–0.3)
Total Bilirubin: 0.4 mg/dL (ref 0.2–1.2)
Total Protein: 6.8 g/dL (ref 6.0–8.3)

## 2023-01-11 LAB — CBC WITH DIFFERENTIAL/PLATELET
Basophils Absolute: 0 10*3/uL (ref 0.0–0.1)
Basophils Relative: 0.9 % (ref 0.0–3.0)
Eosinophils Absolute: 0.1 10*3/uL (ref 0.0–0.7)
Eosinophils Relative: 1.8 % (ref 0.0–5.0)
HCT: 35 % — ABNORMAL LOW (ref 36.0–46.0)
Hemoglobin: 12 g/dL (ref 12.0–15.0)
Lymphocytes Relative: 25.8 % (ref 12.0–46.0)
Lymphs Abs: 1.3 10*3/uL (ref 0.7–4.0)
MCHC: 34.4 g/dL (ref 30.0–36.0)
MCV: 92.6 fl (ref 78.0–100.0)
Monocytes Absolute: 0.4 10*3/uL (ref 0.1–1.0)
Monocytes Relative: 7.5 % (ref 3.0–12.0)
Neutro Abs: 3.3 10*3/uL (ref 1.4–7.7)
Neutrophils Relative %: 64 % (ref 43.0–77.0)
Platelets: 329 10*3/uL (ref 150.0–400.0)
RBC: 3.78 Mil/uL — ABNORMAL LOW (ref 3.87–5.11)
RDW: 12.8 % (ref 11.5–15.5)
WBC: 5.1 10*3/uL (ref 4.0–10.5)

## 2023-01-11 LAB — T4, FREE: Free T4: 0.81 ng/dL (ref 0.60–1.60)

## 2023-01-11 LAB — VITAMIN D 25 HYDROXY (VIT D DEFICIENCY, FRACTURES): VITD: 55.85 ng/mL (ref 30.00–100.00)

## 2023-01-11 LAB — HEMOGLOBIN A1C: Hgb A1c MFr Bld: 6.1 % (ref 4.6–6.5)

## 2023-01-11 LAB — TSH: TSH: 4.35 u[IU]/mL (ref 0.35–5.50)

## 2023-01-12 LAB — CA 125: CA 125: 5 U/mL (ref <35)

## 2023-01-15 ENCOUNTER — Other Ambulatory Visit: Payer: Self-pay | Admitting: Family Medicine

## 2023-01-15 NOTE — Telephone Encounter (Signed)
Last office visit 04/06/2022 for:  Essential hypertension  - Primary    Stage 3a chronic kidney disease (HCC)    Primary osteoarthritis involving multiple joints     Last refilled 07/28/2022 for #90 with 3 refills.  Next Appt: 01/18/2023 for CPE.

## 2023-01-17 NOTE — Progress Notes (Unsigned)
    Jakyrah Holladay T. Tahnee Cifuentes, MD, CAQ Sports Medicine [facility] [address] [place], [address]  Phone: [phone]  FAX: [phone]  Jennifer Holt - 62 y.o. female  MRN [id number]  Date of Birth: 01/04/1961  Date: 01/18/2023  PCP: Hannah Beat, MD  Referral: Hannah Beat, MD  No chief complaint on file.  Subjective:   Jennifer Holt is a 62 y.o. very pleasant female patient with There is no height or weight on file to calculate BMI. who presents with the following:  She is here for chronic medical management after her Medicare wellness exam.  Shingrix Covid  Tdap Mammo  HTN: Tolerating all medications without side effects Stable and at goal No CP, no sob. No HA.  BP Readings from Last 3 Encounters:  04/06/22 120/70  02/11/22 116/76  12/29/21 (!) 144/84    Basic Metabolic Panel:    Component Value Date/Time   NA 141 01/11/2023 0849   NA 137 11/07/2013 1615   K 3.7 01/11/2023 0849   K 4.1 11/07/2013 1615   CL 99 01/11/2023 0849   CL 105 11/07/2013 1615   CO2 31 01/11/2023 0849   CO2 28 11/07/2013 1615   BUN 23 01/11/2023 0849   BUN 18 11/07/2013 1615   CREATININE 1.08 01/11/2023 0849   CREATININE 0.92 08/15/2014 0925   GLUCOSE 113 (H) 01/11/2023 0849   GLUCOSE 95 11/07/2013 1615   CALCIUM 9.4 01/11/2023 0849   CALCIUM 9.7 11/07/2013 1615    Lipids: Doing well, stable. Tolerating meds fine with no SE. Panel reviewed with patient.  Lipids: Lab Results  Component Value Date   CHOL 161 01/11/2023   Lab Results  Component Value Date   HDL 58.30 01/11/2023   Lab Results  Component Value Date   LDLCALC 67 01/11/2023   Lab Results  Component Value Date   TRIG 180.0 (H) 01/11/2023   Lab Results  Component Value Date   CHOLHDL 3 01/11/2023    Lab Results  Component Value Date   ALT 18 01/11/2023   AST 21 01/11/2023   ALKPHOS 56 01/11/2023   BILITOT 0.4 01/11/2023    She also has  significant anxiety and depression.  Panic attacks. -She is on Wellbutrin 150 mg twice daily as well as Xanax 0.5 mg p.o. 3 times daily.   Health Maintenance  Topic Date Due   COVID-19 Vaccine (1) Never done   DTaP/Tdap/Td (1 - Tdap) Never done   Zoster Vaccines- Shingrix (1 of 2) Never done   MAMMOGRAM  07/08/2019   INFLUENZA VACCINE  04/29/2023   Medicare Annual Wellness (AWV)  12/03/2023   COLONOSCOPY (Pts 45-77yrs Insurance coverage will need to be confirmed)  02/12/2032   Hepatitis C Screening  Completed   HIV Screening  Completed   HPV VACCINES  Aged Out   PAP SMEAR-Modifier  Discontinued     Review of Systems is noted in the HPI, as appropriate  Objective:   There were no vitals taken for this visit.  GEN: No acute distress; alert,appropriate. PULM: Breathing comfortably in no respiratory distress PSYCH: Normally interactive.   Laboratory and Imaging Data:  Assessment and Plan:   ***

## 2023-01-18 ENCOUNTER — Encounter: Payer: Self-pay | Admitting: Family Medicine

## 2023-01-18 ENCOUNTER — Ambulatory Visit (INDEPENDENT_AMBULATORY_CARE_PROVIDER_SITE_OTHER): Payer: Medicare Other | Admitting: Family Medicine

## 2023-01-18 VITALS — BP 120/68 | HR 82 | Temp 97.1°F | Ht 64.5 in | Wt 213.4 lb

## 2023-01-18 DIAGNOSIS — N1831 Chronic kidney disease, stage 3a: Secondary | ICD-10-CM | POA: Diagnosis not present

## 2023-01-18 DIAGNOSIS — R7303 Prediabetes: Secondary | ICD-10-CM

## 2023-01-18 DIAGNOSIS — F410 Panic disorder [episodic paroxysmal anxiety] without agoraphobia: Secondary | ICD-10-CM

## 2023-01-18 DIAGNOSIS — E7800 Pure hypercholesterolemia, unspecified: Secondary | ICD-10-CM

## 2023-01-18 DIAGNOSIS — I10 Essential (primary) hypertension: Secondary | ICD-10-CM

## 2023-01-18 DIAGNOSIS — F339 Major depressive disorder, recurrent, unspecified: Secondary | ICD-10-CM | POA: Diagnosis not present

## 2023-01-18 NOTE — Patient Instructions (Signed)
Mammogram and DEXA (bone denstity) - make your own appointment.

## 2023-02-05 ENCOUNTER — Other Ambulatory Visit: Payer: Self-pay | Admitting: Family Medicine

## 2023-02-05 DIAGNOSIS — E781 Pure hyperglyceridemia: Secondary | ICD-10-CM

## 2023-03-09 ENCOUNTER — Other Ambulatory Visit: Payer: Self-pay | Admitting: Family Medicine

## 2023-03-09 DIAGNOSIS — F339 Major depressive disorder, recurrent, unspecified: Secondary | ICD-10-CM

## 2023-03-09 DIAGNOSIS — F410 Panic disorder [episodic paroxysmal anxiety] without agoraphobia: Secondary | ICD-10-CM

## 2023-04-08 ENCOUNTER — Ambulatory Visit
Admission: RE | Admit: 2023-04-08 | Discharge: 2023-04-08 | Disposition: A | Discharge status: Home / Self Care | Payer: Medicare Other | Source: Ambulatory Visit | Attending: Family Medicine | Admitting: Family Medicine

## 2023-04-08 DIAGNOSIS — Z780 Asymptomatic menopausal state: Secondary | ICD-10-CM | POA: Diagnosis present

## 2023-04-08 DIAGNOSIS — Z1231 Encounter for screening mammogram for malignant neoplasm of breast: Secondary | ICD-10-CM | POA: Diagnosis not present

## 2023-05-12 ENCOUNTER — Other Ambulatory Visit: Payer: Self-pay | Admitting: Family Medicine

## 2023-05-12 NOTE — Telephone Encounter (Signed)
Last office visit 01/18/23 for CPE.  Last refilled 10/27/2022 for #60 with 3 refills.  Next Appt: No future appointment with PCP.

## 2023-05-18 ENCOUNTER — Other Ambulatory Visit: Payer: Self-pay | Admitting: Family Medicine

## 2023-05-18 NOTE — Telephone Encounter (Signed)
Last office visit 01/18/2023 for CPE.  Last refilled 01/15/2023 for #90 with 3 refills.  Next Appt:  No future appointments with PCP.

## 2023-07-20 ENCOUNTER — Other Ambulatory Visit: Payer: Self-pay | Admitting: Family Medicine

## 2023-07-20 NOTE — Telephone Encounter (Signed)
Last office visit 01/18/2023 for CPE.  Last refilled 05/12/23 for #60 with 3 refills.  Next Appt: No future appointments with PCP.

## 2023-08-17 ENCOUNTER — Other Ambulatory Visit: Payer: Self-pay | Admitting: Family Medicine

## 2023-08-17 NOTE — Telephone Encounter (Signed)
Last office visit 01/18/2023 for CPE.  Last refilled 07/28/2022 for #90 with 3 refills.  Next Appt: No future appointments.

## 2023-08-18 ENCOUNTER — Other Ambulatory Visit: Payer: Self-pay | Admitting: Family Medicine

## 2023-08-18 DIAGNOSIS — F410 Panic disorder [episodic paroxysmal anxiety] without agoraphobia: Secondary | ICD-10-CM

## 2023-08-18 DIAGNOSIS — F339 Major depressive disorder, recurrent, unspecified: Secondary | ICD-10-CM

## 2023-08-28 ENCOUNTER — Other Ambulatory Visit: Payer: Self-pay | Admitting: Family Medicine

## 2023-09-06 ENCOUNTER — Other Ambulatory Visit: Payer: Self-pay | Admitting: Family Medicine

## 2023-09-06 NOTE — Telephone Encounter (Signed)
Last office visit 01/18/2023 for CPE.  Last refilled 05/18/2023 for #90 with 3 refills.  Next Appt: No future appointments with PCP.

## 2023-10-06 ENCOUNTER — Ambulatory Visit: Payer: Self-pay | Admitting: Family Medicine

## 2023-10-06 NOTE — Telephone Encounter (Signed)
 Copied from CRM [id number]. Topic: Clinical - Medical Advice >> Oct 06, 2023 11:15 AM Merlynn LABOR wrote: Reason for CRM: Patient is out of town and is requesting to speak with nurse regarding not feeling well. Patient is asking to be contacted at [phone]. >> Oct 06, 2023 12:34 PM Tiffany H wrote: Patient called to speak with Elijah. Elijah was with a patient. Advised patient go to [employer]. Did not symptoms.

## 2023-10-06 NOTE — Telephone Encounter (Signed)
 Copied from CRM [id number]. Topic: Clinical - Medical Advice >> Oct 06, 2023 11:15 AM Louie Boston wrote: Reason for CRM: Patient is out of town and is requesting to speak with nurse regarding not feeling well. Patient is asking to be contacted at [phone].

## 2023-10-06 NOTE — Telephone Encounter (Addendum)
 Copied from CRM [id number]. Topic: Clinical - Medical Advice >> Oct 06, 2023 11:15 AM Merlynn LABOR wrote: Reason for CRM: Patient is out of town and is requesting to speak with nurse regarding not feeling well. Patient is asking to be contacted at [phone].   Chief Complaint: Cold symptoms Symptoms: Cough Frequency: Started 1 week and a half ago Pertinent Negatives: Patient denies fever or SOB Disposition: [] ED /[] Urgent Care (no appt availability in office) / [] Appointment(In office/virtual)/ []  [facility]/ [x] Home Care/ [] Refused Recommended Disposition /[] [facility]/ []  Follow-up with PCP  Additional Notes: Patient stated she started having cold symptoms a week ago. She had cough, congestion. She stated her symptoms have improved. She only has a mild cough now. Patient is currently out of state. Care advice provided for treating cough as needed with OTC remedies. Advised patient to call back if symptoms persist or worsen.   Reason for Disposition  Common cold with no complications  Answer Assessment - Initial Assessment Questions 1. ONSET: When did the nasal discharge start?      No  2. COUGH: Do you have a cough? If Yes, ask: Describe the color of your sputum (clear, white, yellow, green)     Cough has improved, mild cough now  3. RESPIRATORY DISTRESS: Describe your breathing.  Breathing okay at this time      4. FEVER: Do you have a fever? If Yes, ask: What is your temperature, how was it measured, and when did it start?     No   5. SEVERITY: Overall, how bad are you feeling right now? (e.g., doesn't interfere with normal activities, staying home from school/work, staying in bed)      Feels better  6.. OTHER SYMPTOMS: Do you have any other symptoms? (e.g., sore throat, earache, wheezing, vomiting)     No  Protocols used: Common Cold-A-AH

## 2023-10-26 ENCOUNTER — Other Ambulatory Visit: Payer: Self-pay | Admitting: Family Medicine

## 2023-10-26 DIAGNOSIS — E781 Pure hyperglyceridemia: Secondary | ICD-10-CM

## 2023-11-30 ENCOUNTER — Other Ambulatory Visit: Payer: Self-pay | Admitting: Family Medicine

## 2023-11-30 NOTE — Telephone Encounter (Signed)
 Last office visit 01/18/2023 for CPE.  Last refilled 07/21/2023 for #60 with 3 refills.  Next appt: No future appointments with PCP.   Please schedule CPE with fasting labs prior for Dr. Patsy Lager after 01/18/2024.

## 2023-11-30 NOTE — Telephone Encounter (Signed)
 Spoke to pt, scheduled cpe for 01/24/24

## 2024-01-04 ENCOUNTER — Telehealth: Payer: Self-pay | Admitting: *Deleted

## 2024-01-04 DIAGNOSIS — E559 Vitamin D deficiency, unspecified: Secondary | ICD-10-CM

## 2024-01-04 DIAGNOSIS — E038 Other specified hypothyroidism: Secondary | ICD-10-CM

## 2024-01-04 DIAGNOSIS — Z79899 Other long term (current) drug therapy: Secondary | ICD-10-CM

## 2024-01-04 DIAGNOSIS — E7800 Pure hypercholesterolemia, unspecified: Secondary | ICD-10-CM

## 2024-01-04 DIAGNOSIS — C541 Malignant neoplasm of endometrium: Secondary | ICD-10-CM

## 2024-01-04 DIAGNOSIS — R739 Hyperglycemia, unspecified: Secondary | ICD-10-CM

## 2024-01-04 NOTE — Telephone Encounter (Signed)
-----   Message from Alvina Chou sent at 01/03/2024 11:23 AM EDT ----- Regarding: Lab orders for Mon, 4.21.25 Patient is scheduled for CPX labs, please order future labs, Thanks , Camelia Eng

## 2024-01-17 ENCOUNTER — Other Ambulatory Visit (INDEPENDENT_AMBULATORY_CARE_PROVIDER_SITE_OTHER)

## 2024-01-17 DIAGNOSIS — R739 Hyperglycemia, unspecified: Secondary | ICD-10-CM | POA: Diagnosis not present

## 2024-01-17 DIAGNOSIS — E038 Other specified hypothyroidism: Secondary | ICD-10-CM | POA: Diagnosis not present

## 2024-01-17 DIAGNOSIS — E7800 Pure hypercholesterolemia, unspecified: Secondary | ICD-10-CM

## 2024-01-17 DIAGNOSIS — C541 Malignant neoplasm of endometrium: Secondary | ICD-10-CM

## 2024-01-17 DIAGNOSIS — Z79899 Other long term (current) drug therapy: Secondary | ICD-10-CM | POA: Diagnosis not present

## 2024-01-17 DIAGNOSIS — E559 Vitamin D deficiency, unspecified: Secondary | ICD-10-CM

## 2024-01-17 LAB — CBC WITH DIFFERENTIAL/PLATELET
Basophils Absolute: 0 10*3/uL (ref 0.0–0.1)
Basophils Relative: 0.9 % (ref 0.0–3.0)
Eosinophils Absolute: 0.1 10*3/uL (ref 0.0–0.7)
Eosinophils Relative: 2.2 % (ref 0.0–5.0)
HCT: 34.4 % — ABNORMAL LOW (ref 36.0–46.0)
Hemoglobin: 11.9 g/dL — ABNORMAL LOW (ref 12.0–15.0)
Lymphocytes Relative: 32.4 % (ref 12.0–46.0)
Lymphs Abs: 1.4 10*3/uL (ref 0.7–4.0)
MCHC: 34.6 g/dL (ref 30.0–36.0)
MCV: 92.8 fl (ref 78.0–100.0)
Monocytes Absolute: 0.4 10*3/uL (ref 0.1–1.0)
Monocytes Relative: 8.6 % (ref 3.0–12.0)
Neutro Abs: 2.4 10*3/uL (ref 1.4–7.7)
Neutrophils Relative %: 55.9 % (ref 43.0–77.0)
Platelets: 258 10*3/uL (ref 150.0–400.0)
RBC: 3.71 Mil/uL — ABNORMAL LOW (ref 3.87–5.11)
RDW: 12.5 % (ref 11.5–15.5)
WBC: 4.2 10*3/uL (ref 4.0–10.5)

## 2024-01-17 LAB — BASIC METABOLIC PANEL WITH GFR
BUN: 25 mg/dL — ABNORMAL HIGH (ref 6–23)
CO2: 29 meq/L (ref 19–32)
Calcium: 9.6 mg/dL (ref 8.4–10.5)
Chloride: 102 meq/L (ref 96–112)
Creatinine, Ser: 1.07 mg/dL (ref 0.40–1.20)
GFR: 54.73 mL/min — ABNORMAL LOW (ref >60.00)
Glucose, Bld: 120 mg/dL — ABNORMAL HIGH (ref 70–99)
Potassium: 3.6 meq/L (ref 3.5–5.1)
Sodium: 140 meq/L (ref 135–145)

## 2024-01-17 LAB — LIPID PANEL
Cholesterol: 150 mg/dL (ref 0–200)
HDL: 54.1 mg/dL (ref >39.00)
LDL Cholesterol: 72 mg/dL (ref 0–99)
NonHDL: 96.16
Total CHOL/HDL Ratio: 3
Triglycerides: 120 mg/dL (ref 0.0–149.0)
VLDL: 24 mg/dL (ref 0.0–40.0)

## 2024-01-17 LAB — HEPATIC FUNCTION PANEL
ALT: 16 U/L (ref 0–35)
AST: 19 U/L (ref 0–37)
Albumin: 4.4 g/dL (ref 3.5–5.2)
Alkaline Phosphatase: 49 U/L (ref 39–117)
Bilirubin, Direct: 0.1 mg/dL (ref 0.0–0.3)
Total Bilirubin: 0.4 mg/dL (ref 0.2–1.2)
Total Protein: 7 g/dL (ref 6.0–8.3)

## 2024-01-17 LAB — T3, FREE: T3, Free: 3.1 pg/mL (ref 2.3–4.2)

## 2024-01-17 LAB — HEMOGLOBIN A1C: Hgb A1c MFr Bld: 6.1 % (ref 4.6–6.5)

## 2024-01-17 LAB — VITAMIN D 25 HYDROXY (VIT D DEFICIENCY, FRACTURES): VITD: 63.37 ng/mL (ref 30.00–100.00)

## 2024-01-17 LAB — TSH: TSH: 5.1 u[IU]/mL (ref 0.35–5.50)

## 2024-01-17 LAB — T4, FREE: Free T4: 0.7 ng/dL (ref 0.60–1.60)

## 2024-01-18 LAB — CA 125: CA 125: 5 U/mL (ref <35)

## 2024-01-20 ENCOUNTER — Other Ambulatory Visit: Payer: Self-pay | Admitting: Family Medicine

## 2024-01-20 DIAGNOSIS — E781 Pure hyperglyceridemia: Secondary | ICD-10-CM

## 2024-01-23 ENCOUNTER — Encounter: Payer: Self-pay | Admitting: Family Medicine

## 2024-01-23 NOTE — Progress Notes (Unsigned)
 Jennifer Leib T. Maahir Horst, MD, CAQ Sports Medicine [facility] at [facility] [address] [place], [address]  Phone: [phone]  FAX: [phone]  Jennifer Holt - 63 y.o. female  MRN [id number]  Date of Birth: Jul 06, 1961  Date: 01/24/2024  PCP: Scherrie Curt, MD  Referral: Scherrie Curt, MD  No chief complaint on file.  Patient Care Team: Scherrie Curt, MD as PCP - General (Family Medicine) Constancia Delton, MD as PCP - Cardiology (Cardiology) Subjective:   Jennifer Holt is a 63 y.o. pleasant patient who presents for a medicare wellness examination:  Health Maintenance Summary Reviewed and updated, unless pt declines services.  Tobacco History Reviewed. Non-smoker Alcohol: No concerns, no excessive use Exercise Habits: Some activity, rec at least 30 mins 5 times a week STD concerns: none Drug Use: None Birth control method: n/a Menses regular: n/a Lumps or breast concerns: no Breast Cancer Family History: no  Prevnar COVID Shingrix Flu booster RSV - She has always declined all vaccination  She does have a history of endometrial consult that has been followed by [facility] oncology.  She also has a history of relatively severe chronic anxiety and panic attacks along with depression. She is currently on Wellbutrin  150 mg p.o. twice daily Xanax  0.5 mg p.o. 3 times daily Elavil  25 mg at night  She also has some issues with chronic pain in take some tramadol  as needed along with meloxicam  15 mg a day.    Health Maintenance  Topic Date Due   COVID-19 Vaccine (1) Never done   Pneumococcal Vaccine 82-79 Years old (1 of 2 - PCV) Never done   Zoster Vaccines- Shingrix (1 of 2) Never done   Medicare Annual Wellness (AWV)  12/03/2023   MAMMOGRAM  04/07/2024   INFLUENZA VACCINE  04/28/2024   Colonoscopy  02/12/2032   Hepatitis C Screening  Completed   HIV Screening  Completed   HPV VACCINES  Aged Out   Meningococcal B Vaccine   Aged Out   DTaP/Tdap/Td  Discontinued    There is no immunization history on file for this patient.  Patient Active Problem List   Diagnosis Date Noted   FIGO stage II endometrial cancer (HCC)     Priority: High   CKD (chronic kidney disease) stage 3, GFR 30-59 ml/min (HCC) 12/29/2021    Priority: Medium    Essential hypertension     Priority: Medium    Pure hypercholesterolemia     Priority: Medium    Major depressive disorder, recurrent episode with anxious distress (HCC)     Priority: Medium    Panic attacks     Priority: Medium    High triglycerides 11/05/2019   Allergic rhinitis due to pollen    GERD (gastroesophageal reflux disease)    Osteoarthritis, multiple sites     Past Medical History:  Diagnosis Date   Allergic rhinitis due to pollen    Blood transfusion without reported diagnosis    CKD (chronic kidney disease) stage 3, GFR 30-59 ml/min (HCC) 12/29/2021   FIGO stage II endometrial cancer [facility])    [facility]. + malignant cells in peritoneal fluid.    GERD (gastroesophageal reflux disease)    Hyperlipidemia    Hypertension    Major depressive disorder, recurrent episode with anxious distress (HCC)    Osteoarthritis, multiple sites    Panic attacks    Personal history of radiation therapy 2015   uterine ca    Past Surgical History:  Procedure  Laterality Date   ABDOMINAL HYSTERECTOMY     COLONOSCOPY     COLONOSCOPY WITH PROPOFOL  N/A 02/11/2022   Procedure: COLONOSCOPY WITH PROPOFOL ;  Surgeon: Luke Salaam, MD;  Location: [facility] ENDOSCOPY;  Service: Gastroenterology;  Laterality: N/A;   TONSILLECTOMY     TONSILLECTOMY AND ADENOIDECTOMY     TOTAL ABDOMINAL HYSTERECTOMY W/ BILATERAL SALPINGOOPHORECTOMY     BSO    Family History  Problem Relation Age of Onset   Arthritis Mother    Hyperlipidemia Mother    Stroke Mother    Hypertension Mother    Mental illness Mother    Hyperlipidemia Father    Heart disease Father    Hypertension Father    Mental  illness Brother    Stroke Maternal Grandmother    Hypertension Maternal Grandmother    Mental illness Brother    Breast cancer Sister    Breast cancer Paternal Aunt    Cancer Paternal Aunt     Social History   Social History Narrative   Not on file    Past Medical History, Surgical History, Social History, Family History, Problem List, Medications, and Allergies have been reviewed and updated if relevant.  Review of Systems: Pertinent positives are listed above.  Otherwise, a full 14 point review of systems has been done in full and it is negative except where it is noted positive.  Objective:   There were no vitals taken for this visit.    11/29/2020   10:26 AM 12/01/2021   10:38 AM 02/11/2022    7:01 AM 02/26/2022   11:11 AM 12/03/2022    9:13 AM  Fall Risk  Falls in the past year? 0 0  0 0  Was there an injury with Fall? 0 0   0  Fall Risk Category Calculator 0 0   0  Fall Risk Category (Retired) Low Low     (RETIRED) Patient Fall Risk Level Low fall risk Low fall risk Low fall risk    Patient at Risk for Falls Due to Medication side effect No Fall Risks   No Fall Risks  Fall risk Follow up Falls evaluation completed;Falls prevention discussed Falls prevention discussed   Falls prevention discussed;Falls evaluation completed   Ideal Body Weight:   No results found.    01/18/2023    8:13 AM 12/03/2022    9:12 AM 12/03/2022    9:10 AM 04/06/2022   10:55 AM 02/26/2022   11:11 AM  Depression screen PHQ 2/9  Decreased Interest 1 0 1 2   Down, Depressed, Hopeless 1 1 0 1 1  PHQ - 2 Score 2 1 1 3 1   Altered sleeping 2   0   Tired, decreased energy 2   3   Change in appetite 2   2   Feeling bad or failure about yourself  1   0   Trouble concentrating 0   0   Moving slowly or fidgety/restless 0   0   Suicidal thoughts 0      PHQ-9 Score 9   8   Difficult doing work/chores Not difficult at all   Somewhat difficult      GEN: well developed, well nourished, no acute  distress Eyes: conjunctiva and lids normal, PERRLA, EOMI ENT: TM clear, nares clear, oral exam WNL Neck: supple, no lymphadenopathy, no thyromegaly, no JVD Pulm: clear to auscultation and percussion, respiratory effort normal CV: regular rate and rhythm, S1-S2, no murmur, rub or gallop, no bruits Chest: no scars, masses,  no lumps BREAST: no lumps, no axillary LAD, no nipple discharge GI: soft, non-tender; no hepatosplenomegaly, masses; active bowel sounds all quadrants GU: Normal external female genitalia. Cervix appears intact without lesions or irritation. Vaginal canal normal without ulceration or lesion. Cervix NT to exam. Ovaries neither enlarged nor tender. (Chaperoned examination by female staff) Lymph: no cervical, axillary or inguinal adenopathy MSK: gait normal, muscle tone and strength WNL, no joint swelling, effusions, discoloration, crepitus  SKIN: clear, good turgor, color WNL, no rashes, lesions, or ulcerations Neuro: normal mental status, normal strength, sensation, and motion Psych: alert; oriented to person, place and time, normally interactive and not anxious or depressed in appearance.  All labs reviewed with patient.  Results for orders placed or performed in visit on 01/17/24  CA 125   Collection Time: 01/17/24  8:25 AM  Result Value Ref Range   CA 125 5 <35 U/mL  VITAMIN D  25 Hydroxy (Vit-D Deficiency, Fractures)   Collection Time: 01/17/24  8:25 AM  Result Value Ref Range   VITD 63.37 30.00 - 100.00 ng/mL  T3, free   Collection Time: 01/17/24  8:25 AM  Result Value Ref Range   T3, Free 3.1 2.3 - 4.2 pg/mL  T4, free   Collection Time: 01/17/24  8:25 AM  Result Value Ref Range   Free T4 0.70 0.60 - 1.60 ng/dL  TSH   Collection Time: 01/17/24  8:25 AM  Result Value Ref Range   TSH 5.10 0.35 - 5.50 uIU/mL  Lipid panel   Collection Time: 01/17/24  8:25 AM  Result Value Ref Range   Cholesterol 150 0 - 200 mg/dL   Triglycerides 161.0 0.0 - 149.0 mg/dL    HDL 96.04 >54.09 mg/dL   VLDL 81.1 0.0 - 91.4 mg/dL   LDL Cholesterol 72 0 - 99 mg/dL   Total CHOL/HDL Ratio 3    NonHDL 96.16   Hemoglobin A1c   Collection Time: 01/17/24  8:25 AM  Result Value Ref Range   Hgb A1c MFr Bld 6.1 4.6 - 6.5 %  Hepatic function panel   Collection Time: 01/17/24  8:25 AM  Result Value Ref Range   Total Bilirubin 0.4 0.2 - 1.2 mg/dL   Bilirubin, Direct 0.1 0.0 - 0.3 mg/dL   Alkaline Phosphatase 49 39 - 117 U/L   AST 19 0 - 37 U/L   ALT 16 0 - 35 U/L   Total Protein 7.0 6.0 - 8.3 g/dL   Albumin 4.4 3.5 - 5.2 g/dL  CBC with Differential/Platelet   Collection Time: 01/17/24  8:25 AM  Result Value Ref Range   WBC 4.2 4.0 - 10.5 K/uL   RBC 3.71 (L) 3.87 - 5.11 Mil/uL   Hemoglobin 11.9 (L) 12.0 - 15.0 g/dL   HCT 78.2 (L) 95.6 - 21.3 %   MCV 92.8 78.0 - 100.0 fl   MCHC 34.6 30.0 - 36.0 g/dL   RDW 08.6 57.8 - 46.9 %   Platelets 258.0 150.0 - 400.0 K/uL   Neutrophils Relative % 55.9 43.0 - 77.0 %   Lymphocytes Relative 32.4 12.0 - 46.0 %   Monocytes Relative 8.6 3.0 - 12.0 %   Eosinophils Relative 2.2 0.0 - 5.0 %   Basophils Relative 0.9 0.0 - 3.0 %   Neutro Abs 2.4 1.4 - 7.7 K/uL   Lymphs Abs 1.4 0.7 - 4.0 K/uL   Monocytes Absolute 0.4 0.1 - 1.0 K/uL   Eosinophils Absolute 0.1 0.0 - 0.7 K/uL   Basophils Absolute 0.0 0.0 -  0.1 K/uL  Basic metabolic panel   Collection Time: 01/17/24  8:25 AM  Result Value Ref Range   Sodium 140 135 - 145 mEq/L   Potassium 3.6 3.5 - 5.1 mEq/L   Chloride 102 96 - 112 mEq/L   CO2 29 19 - 32 mEq/L   Glucose, Bld 120 (H) 70 - 99 mg/dL   BUN 25 (H) 6 - 23 mg/dL   Creatinine, Ser 1.61 0.40 - 1.20 mg/dL   GFR 09.60 (L) >45.40 mL/min   Calcium  9.6 8.4 - 10.5 mg/dL    No results found.  Assessment and Plan:     ICD-10-CM   1. Healthcare maintenance  Z00.00       Health Maintenance Exam: The patient's preventative maintenance and recommended screening tests for an annual wellness exam were reviewed in full  today. Brought up to date unless services declined.  Counselled on the importance of diet, exercise, and its role in overall health and mortality. The patient's FH and SH was reviewed, including their home life, tobacco status, and drug and alcohol status.  Follow-up in 1 year for physical exam or additional follow-up below.  I have personally reviewed the Medicare Annual Wellness questionnaire and have noted 1. The patient's medical and social history 2. Their use of alcohol, tobacco or illicit drugs 3. Their current medications and supplements 4. The patient's functional ability including ADL's, fall risks, home safety risks and hearing or visual             impairment. 5. Diet and physical activities 6. Evidence for depression or mood disorders 7. Reviewed Updated provider list, see scanned forms and [facility] Snapshot.  8. Reviewed whether or not the patient has HCPOA or living will, and discussed what this means with the patient.  Recommended she bring in a copy for his chart in [facility].  The patients weight, height, BMI and visual acuity have been recorded in the chart I have made referrals, counseling and provided education to the patient based review of the above and I have provided the pt with a written personalized care plan for preventive services.  I have provided the patient with a copy of your personalized plan for preventive services. Instructed to take the time to review along with their updated medication list.  Disposition: No follow-ups on file.  Future Appointments  Date Time Provider Department Center  01/24/2024  9:40 AM Lachlyn Vanderstelt, Jolena Nay, MD [facility]  02/15/2024  8:50 AM [facility] ANNUAL WELLNESS VISIT 2 [facility]    No orders of the defined types were placed in this encounter.  There are no discontinued medications. No orders of the defined types were placed in this encounter.   Signed,  Ranny Bye. Winfrey Chillemi, MD   Allergies as of 01/24/2024       Reactions    Ace Inhibitors Swelling   Angioedema.   Prednisone Other (See Comments)   Flu like symptoms        Medication List        Accurate as of January 23, 2024  5:34 PM. If you have any questions, ask your nurse or doctor.          ALPRAZolam  0.5 MG tablet Commonly known as: XANAX  TAKE ONE TABLET BY MOUTH THREE TIMES DAILY AS NEEDED   amitriptyline  25 MG tablet Commonly known as: ELAVIL  TAKE ONE TABLET BY MOUTH EVERY NIGHT AT BEDTIME   amLODipine  5 MG tablet Commonly known as: NORVASC  TAKE ONE TABLET BY MOUTH ONCE A DAY  atorvastatin  40 MG tablet Commonly known as: LIPITOR TAKE ONE TABLET BY MOUTH EVERY NIGHT AT BEDTIME   buPROPion  150 MG 12 hr tablet Commonly known as: WELLBUTRIN  SR TAKE ONE TABLET BY MOUTH TWICE A DAY   cetirizine 10 MG tablet Commonly known as: ZYRTEC Take 10 mg by mouth daily as needed.   fenofibrate  160 MG tablet TAKE ONE TABLET BY MOUTH ONCE A DAY   fluticasone 50 MCG/ACT nasal spray Commonly known as: FLONASE Place 2 sprays into both nostrils daily as needed.   hydrochlorothiazide  25 MG tablet Commonly known as: HYDRODIURIL  TAKE ONE TABLET BY MOUTH ONCE A DAY   meloxicam  15 MG tablet Commonly known as: MOBIC  TAKE ONE TABLET BY MOUTH ONCE A DAY   metoprolol  tartrate 100 MG tablet Commonly known as: LOPRESSOR  TAKE ONE TABLET BY MOUTH TWICE A DAY   traMADol  50 MG tablet Commonly known as: ULTRAM  Take 1 tablet (50 mg total) by mouth every 8 (eight) hours as needed.   Tums 500 MG chewable tablet Generic drug: calcium  carbonate Chew 1 tablet by mouth as needed for indigestion.   Vitamin D3 50 MCG (2000 UT) Tabs Take by mouth.

## 2024-01-24 ENCOUNTER — Ambulatory Visit (INDEPENDENT_AMBULATORY_CARE_PROVIDER_SITE_OTHER): Admitting: Family Medicine

## 2024-01-24 ENCOUNTER — Encounter: Payer: Self-pay | Admitting: Family Medicine

## 2024-01-24 VITALS — BP 130/74 | HR 62 | Temp 97.1°F | Ht 64.25 in | Wt 216.2 lb

## 2024-01-24 DIAGNOSIS — Z0000 Encounter for general adult medical examination without abnormal findings: Secondary | ICD-10-CM | POA: Diagnosis not present

## 2024-01-24 MED ORDER — ESCITALOPRAM OXALATE 5 MG PO TABS
5.0000 mg | ORAL_TABLET | Freq: Every day | ORAL | 3 refills | Status: DC
Start: 2024-01-24 — End: 2024-05-09

## 2024-01-24 NOTE — Patient Instructions (Signed)
 Vaccinations recommended:  Prevnar COVID Shingrix Flu booster RSV

## 2024-01-25 ENCOUNTER — Encounter: Payer: Self-pay | Admitting: Family Medicine

## 2024-02-03 ENCOUNTER — Other Ambulatory Visit: Payer: Self-pay | Admitting: Family Medicine

## 2024-02-15 ENCOUNTER — Encounter

## 2024-02-18 ENCOUNTER — Other Ambulatory Visit: Payer: Self-pay | Admitting: Family Medicine

## 2024-02-22 ENCOUNTER — Other Ambulatory Visit: Payer: Self-pay | Admitting: Family Medicine

## 2024-02-22 DIAGNOSIS — F410 Panic disorder [episodic paroxysmal anxiety] without agoraphobia: Secondary | ICD-10-CM

## 2024-02-22 DIAGNOSIS — F339 Major depressive disorder, recurrent, unspecified: Secondary | ICD-10-CM

## 2024-03-13 ENCOUNTER — Other Ambulatory Visit: Payer: Self-pay | Admitting: Family Medicine

## 2024-03-14 NOTE — Telephone Encounter (Signed)
 Last office visit 01/24/2024 for CPE. Last refilled 09/06/2023 for #90 with 3 refills. Next Appt: 03/22/24

## 2024-03-19 NOTE — Progress Notes (Unsigned)
     Jakhari Space T. Kolette Vey, MD, CAQ Sports Medicine [facility] at [facility] [address] [place], [address]  Phone: [phone]  FAX: [phone]  Jennifer Holt - 63 y.o. female  MRN [id number]  Date of Birth: 07-28-1961  Date: 03/22/2024  PCP: Watt Mirza, MD  Referral: Watt Mirza, MD  No chief complaint on file.  Subjective:   Jennifer Holt is a 63 y.o. very pleasant female patient with There is no height or weight on file to calculate BMI. who presents with the following:  She is a pleasant patient who presents in follow-up for depression and anxiety as well as panic attacks.  I recently started her on Lexapro  5 mg.  She additionally takes Xanax  0.5 mg 3 times daily.    Review of Systems is noted in the HPI, as appropriate  Objective:   There were no vitals taken for this visit.  GEN: No acute distress; alert,appropriate. PULM: Breathing comfortably in no respiratory distress PSYCH: Normally interactive.   Laboratory and Imaging Data:  Assessment and Plan:   ***

## 2024-03-22 ENCOUNTER — Encounter: Payer: Self-pay | Admitting: Family Medicine

## 2024-03-22 ENCOUNTER — Ambulatory Visit (INDEPENDENT_AMBULATORY_CARE_PROVIDER_SITE_OTHER): Admitting: Family Medicine

## 2024-03-22 VITALS — BP 138/76 | HR 66 | Temp 97.1°F | Ht 64.25 in | Wt 214.5 lb

## 2024-03-22 DIAGNOSIS — F339 Major depressive disorder, recurrent, unspecified: Secondary | ICD-10-CM | POA: Diagnosis not present

## 2024-03-22 DIAGNOSIS — F410 Panic disorder [episodic paroxysmal anxiety] without agoraphobia: Secondary | ICD-10-CM

## 2024-03-22 DIAGNOSIS — M5412 Radiculopathy, cervical region: Secondary | ICD-10-CM

## 2024-03-30 ENCOUNTER — Other Ambulatory Visit: Payer: Self-pay | Admitting: Family Medicine

## 2024-05-09 ENCOUNTER — Other Ambulatory Visit: Payer: Self-pay | Admitting: Family Medicine

## 2024-07-24 ENCOUNTER — Other Ambulatory Visit: Payer: Self-pay | Admitting: Family Medicine

## 2024-07-24 NOTE — Telephone Encounter (Signed)
 Last office visit 03/22/2024 for Panic Attacks/Depression.  Last refilled 03/15/24 for #90 with 3 refills.  Next Appt: CPE 01/24/2025.

## 2024-08-03 ENCOUNTER — Other Ambulatory Visit: Payer: Self-pay | Admitting: Family Medicine

## 2024-09-04 ENCOUNTER — Other Ambulatory Visit: Payer: Self-pay | Admitting: Family Medicine

## 2024-09-04 DIAGNOSIS — F339 Major depressive disorder, recurrent, unspecified: Secondary | ICD-10-CM

## 2024-09-04 DIAGNOSIS — F410 Panic disorder [episodic paroxysmal anxiety] without agoraphobia: Secondary | ICD-10-CM

## 2024-10-09 ENCOUNTER — Other Ambulatory Visit: Payer: Self-pay | Admitting: Family Medicine

## 2025-01-17 ENCOUNTER — Other Ambulatory Visit

## 2025-01-24 ENCOUNTER — Encounter: Admitting: Family Medicine
